# Patient Record
Sex: Male | Born: 1980 | Race: White | Hispanic: No | Marital: Married | Smoking: Former smoker
Health system: Southern US, Community
[De-identification: ages and names within clinical notes are randomized; demographics above are authoritative.]

## PROBLEM LIST (undated history)

## (undated) DIAGNOSIS — G510 Bell's palsy: Secondary | ICD-10-CM

## (undated) DIAGNOSIS — K469 Unspecified abdominal hernia without obstruction or gangrene: Secondary | ICD-10-CM

## (undated) DIAGNOSIS — K4090 Unilateral inguinal hernia, without obstruction or gangrene, not specified as recurrent: Secondary | ICD-10-CM

## (undated) DIAGNOSIS — J302 Other seasonal allergic rhinitis: Secondary | ICD-10-CM

## (undated) HISTORY — DX: Bell's palsy: G51.0

## (undated) HISTORY — PX: WISDOM TOOTH EXTRACTION: SHX21

## (undated) HISTORY — PX: TONSILLECTOMY: SUR1361

## (undated) HISTORY — PX: HERNIA REPAIR: SHX51

---

## 2009-12-12 ENCOUNTER — Emergency Department (HOSPITAL_COMMUNITY): Admission: EM | Admit: 2009-12-12 | Discharge: 2009-12-12 | Payer: Self-pay | Admitting: Emergency Medicine

## 2010-10-01 LAB — CBC
MCHC: 35.1 g/dL (ref 30.0–36.0)
MCV: 95.1 fL (ref 78.0–100.0)
Platelets: 222 10*3/uL (ref 150–400)
RDW: 13 % (ref 11.5–15.5)

## 2010-10-01 LAB — COMPREHENSIVE METABOLIC PANEL
BUN: 16 mg/dL (ref 6–23)
CO2: 27 mEq/L (ref 19–32)
Calcium: 9.8 mg/dL (ref 8.4–10.5)
Creatinine, Ser: 1.16 mg/dL (ref 0.4–1.5)
GFR calc Af Amer: >60 mL/min (ref >60)
Glucose, Bld: 117 mg/dL — ABNORMAL HIGH (ref 70–99)
Potassium: 3.7 mEq/L (ref 3.5–5.1)
Sodium: 140 mEq/L (ref 135–145)
Total Protein: 7.2 g/dL (ref 6.0–8.3)

## 2010-10-01 LAB — DIFFERENTIAL
Basophils Relative: 0 % (ref 0–1)
Eosinophils Relative: 1 % (ref 0–5)
Lymphocytes Relative: 26 % (ref 12–46)
Monocytes Relative: 8 % (ref 3–12)

## 2010-10-01 LAB — URINALYSIS, ROUTINE W REFLEX MICROSCOPIC
Glucose, UA: NEGATIVE mg/dL
Hgb urine dipstick: NEGATIVE
Protein, ur: NEGATIVE mg/dL
Specific Gravity, Urine: 1.026 (ref 1.005–1.030)
pH: 7 (ref 5.0–8.0)

## 2010-10-01 LAB — POCT CARDIAC MARKERS
CKMB, poc: <1 ng/mL — ABNORMAL LOW (ref 1.0–8.0)
Troponin i, poc: <0.05 ng/mL (ref 0.00–0.09)

## 2010-10-01 LAB — URINE MICROSCOPIC-ADD ON

## 2010-10-01 LAB — LIPASE, BLOOD: Lipase: 38 U/L (ref 11–59)

## 2010-10-01 LAB — ETHANOL: Alcohol, Ethyl (B): <5 mg/dL (ref 0–10)

## 2016-09-02 ENCOUNTER — Encounter (HOSPITAL_COMMUNITY): Payer: Self-pay | Admitting: Nurse Practitioner

## 2016-09-02 ENCOUNTER — Inpatient Hospital Stay (HOSPITAL_COMMUNITY)
Admission: EM | Admit: 2016-09-02 | Discharge: 2016-09-05 | DRG: 201 | Disposition: A | Discharge status: Home / Self Care | Payer: Managed Care, Other (non HMO) | Attending: Pulmonary Disease | Admitting: Pulmonary Disease

## 2016-09-02 ENCOUNTER — Inpatient Hospital Stay (HOSPITAL_COMMUNITY): Payer: Managed Care, Other (non HMO)

## 2016-09-02 ENCOUNTER — Emergency Department (HOSPITAL_COMMUNITY): Payer: Managed Care, Other (non HMO)

## 2016-09-02 DIAGNOSIS — Z825 Family history of asthma and other chronic lower respiratory diseases: Secondary | ICD-10-CM

## 2016-09-02 DIAGNOSIS — E669 Obesity, unspecified: Secondary | ICD-10-CM | POA: Diagnosis present

## 2016-09-02 DIAGNOSIS — J439 Emphysema, unspecified: Secondary | ICD-10-CM | POA: Diagnosis present

## 2016-09-02 DIAGNOSIS — J9311 Primary spontaneous pneumothorax: Secondary | ICD-10-CM | POA: Diagnosis present

## 2016-09-02 DIAGNOSIS — Z6834 Body mass index (BMI) 34.0-34.9, adult: Secondary | ICD-10-CM

## 2016-09-02 DIAGNOSIS — Z9889 Other specified postprocedural states: Secondary | ICD-10-CM | POA: Diagnosis not present

## 2016-09-02 DIAGNOSIS — Z87891 Personal history of nicotine dependence: Secondary | ICD-10-CM | POA: Diagnosis not present

## 2016-09-02 DIAGNOSIS — K469 Unspecified abdominal hernia without obstruction or gangrene: Secondary | ICD-10-CM | POA: Diagnosis present

## 2016-09-02 DIAGNOSIS — Z880 Allergy status to penicillin: Secondary | ICD-10-CM | POA: Diagnosis not present

## 2016-09-02 DIAGNOSIS — J939 Pneumothorax, unspecified: Secondary | ICD-10-CM

## 2016-09-02 DIAGNOSIS — J9383 Other pneumothorax: Secondary | ICD-10-CM | POA: Diagnosis present

## 2016-09-02 HISTORY — DX: Unspecified abdominal hernia without obstruction or gangrene: K46.9

## 2016-09-02 HISTORY — DX: Other seasonal allergic rhinitis: J30.2

## 2016-09-02 HISTORY — DX: Unilateral inguinal hernia, without obstruction or gangrene, not specified as recurrent: K40.90

## 2016-09-02 LAB — I-STAT CHEM 8, ED
BUN: 22 mg/dL — AB (ref 6–20)
CALCIUM ION: 1.13 mmol/L — AB (ref 1.15–1.40)
Chloride: 103 mmol/L (ref 101–111)
Creatinine, Ser: 1.2 mg/dL (ref 0.61–1.24)
Glucose, Bld: 119 mg/dL — ABNORMAL HIGH (ref 65–99)
HEMATOCRIT: 50 % (ref 39.0–52.0)
HEMOGLOBIN: 17 g/dL (ref 13.0–17.0)
Potassium: 3.6 mmol/L (ref 3.5–5.1)
SODIUM: 142 mmol/L (ref 135–145)
TCO2: 26 mmol/L (ref 0–100)

## 2016-09-02 LAB — CBC
HCT: 48.8 % (ref 39.0–52.0)
HEMOGLOBIN: 17.2 g/dL — AB (ref 13.0–17.0)
MCH: 32.6 pg (ref 26.0–34.0)
MCHC: 35.2 g/dL (ref 30.0–36.0)
MCV: 92.6 fL (ref 78.0–100.0)
PLATELETS: 263 10*3/uL (ref 150–400)
RBC: 5.27 MIL/uL (ref 4.22–5.81)
RDW: 12.6 % (ref 11.5–15.5)
WBC: 11.8 10*3/uL — ABNORMAL HIGH (ref 4.0–10.5)

## 2016-09-02 MED ORDER — SODIUM CHLORIDE 0.9% FLUSH
3.0000 mL | Freq: Two times a day (BID) | INTRAVENOUS | Status: DC
  Administered 2016-09-04: 3 mL via INTRAVENOUS

## 2016-09-02 MED ORDER — ACETAMINOPHEN 650 MG RE SUPP: 650.0000 mg | Freq: Four times a day (QID) | RECTAL | Status: DC | PRN

## 2016-09-02 MED ORDER — ACETAMINOPHEN 325 MG PO TABS: 650.0000 mg | ORAL_TABLET | Freq: Four times a day (QID) | ORAL | Status: DC | PRN

## 2016-09-02 MED ORDER — KETAMINE HCL-SODIUM CHLORIDE 100-0.9 MG/10ML-% IV SOSY
0.3000 mg/kg | PREFILLED_SYRINGE | Freq: Once | INTRAVENOUS | Status: AC
  Administered 2016-09-02: 33 mg via INTRAVENOUS
  Filled 2016-09-02: qty 10

## 2016-09-02 MED ORDER — KETOROLAC TROMETHAMINE 15 MG/ML IJ SOLN
15.0000 mg | Freq: Four times a day (QID) | INTRAMUSCULAR | Status: AC
  Administered 2016-09-02 – 2016-09-03 (×4): 15 mg via INTRAVENOUS
  Filled 2016-09-02 (×4): qty 1

## 2016-09-02 MED ORDER — HYDROCODONE-ACETAMINOPHEN 5-325 MG PO TABS
1.0000 | ORAL_TABLET | ORAL | Status: DC | PRN
  Administered 2016-09-02: 2 via ORAL
  Filled 2016-09-02: qty 2

## 2016-09-02 MED ORDER — MORPHINE SULFATE (PF) 2 MG/ML IV SOLN: 2.0000 mg | INTRAVENOUS | Status: DC | PRN

## 2016-09-02 MED ORDER — SODIUM CHLORIDE 0.9 % IV SOLN
INTRAVENOUS | Status: DC
  Administered 2016-09-02: 1000 mL via INTRAVENOUS
  Administered 2016-09-03: 16:00:00 via INTRAVENOUS

## 2016-09-02 MED ORDER — LIDOCAINE-EPINEPHRINE (PF) 2 %-1:200000 IJ SOLN
20.0000 mL | Freq: Once | INTRAMUSCULAR | Status: AC
  Administered 2016-09-02: 20 mL
  Filled 2016-09-02: qty 20

## 2016-09-02 NOTE — Sedation Documentation (Signed)
Vital signs stable. 

## 2016-09-02 NOTE — ED Notes (Signed)
Report attempted, RN to call back. 

## 2016-09-02 NOTE — H&P (Signed)
History and Physical    Richard Orozco ZOX:[id number] DOB: 20-Feb-1981 DOA: 09/02/2016  PCP: Noni SaupeEDDING II,JOHN F., MD   Patient coming from: Home  Chief Complaint: Advised to report to the ED after abnormal outpatient chest xray  HPI: Richard Orozco is a 36 y.o. gentleman with a history of inguinal hernia and recent bronchitis and sinusitis treated as an outpatient with antibiotics and steroid dose pack approximately two weeks ago.  In the past 7-9 days, he has had recurring discomfort in his upper back on the right.  He does not really call it pain, but he describes it as a "popping sensation" or feeling like he is being punched in his back.  He has had pleuritic chest pain as well.  No significant shortness of breath.  He has occasional palpitation that he relates to anxiety, but this has not been different in the past 1-2 weeks.  No substernal chest pain, pressure, or tightness.  No syncope or LOC.  No fever.  No nausea, vomiting, or diarrhea.  The patient presented to urgent care yesterday for evaluation.  Outpatient chest xray was obtained and revealed a spontaneous pneumothorax on the right.  The patient was contacted at home and advised to present to the ED for management.    ED Course: Right-sided chest tube placed in the ED by the ED attending.  Hospitalist asked to admit.  Pulmonary consult requested for assistance with management.  The patient was seen and evaluated on the floor.  Currently complaining of chest wall pain after placement of chest tube.  Wife at bedside.  Review of Systems: As per HPI otherwise 10 point review of systems negative.    Past Medical History:  Diagnosis Date  . Hernia of abdominal cavity   . Inguinal hernia     Past Surgical History:  Procedure Laterality Date  . TONSILLECTOMY    . WISDOM TOOTH EXTRACTION       reports that he has quit smoking. He has never used smokeless tobacco. He reports that he drinks alcohol. He reports that he does not use  drugs.  He is married.  He has two children.  He works for [employer].  Allergies  Allergen Reactions  . Penicillins Other (See Comments)    Childhood allergy   . Shellfish Allergy Other (See Comments)    Family allergy (severe reaction)    Family History  Problem Relation Age of Onset  . Hemachromatosis Mother   . COPD Maternal Grandmother      Prior to Admission medications   Not on File    Physical Exam: Vitals:   09/02/16 1858 09/02/16 1907 09/02/16 1913 09/02/16 1927  BP: 154/97 (!) 206/101 170/94 148/98  Pulse: 69 94 77 60  Resp: 21 (!) 27 25 21   Temp:      TempSrc:      SpO2: 100% 100% 98% 99%  Weight:          Constitutional: NAD, calm, in pain after chest tube placed in the ED Vitals:   09/02/16 1858 09/02/16 1907 09/02/16 1913 09/02/16 1927  BP: 154/97 (!) 206/101 170/94 148/98  Pulse: 69 94 77 60  Resp: 21 (!) 27 25 21   Temp:      TempSrc:      SpO2: 100% 100% 98% 99%  Weight:       Eyes: PERRL, lids and conjunctivae normal ENMT: Mucous membranes are slightly dry.  Posterior pharynx NOT visualized.  Normal dentition.  Neck: normal appearance, thick but supple, no  masses Respiratory: clear to auscultation listening anteriorly.  No wheezing, no crackles. Normal respiratory effort. No accessory muscle use.  Cardiovascular: Normal rate, regular rhythm, no murmurs / rubs / gallops. No extremity edema. 2+ pedal pulses. GI: abdomen is soft and compressible.  No distention.  No tenderness.  No masses palpated.  Bowel sounds are present. Musculoskeletal:  No joint deformity in upper and lower extremities. Good ROM, no contractures. Normal muscle tone.  Skin: no rashes, warm and dry Neurologic: CN 2-12 grossly intact. Sensation intact, Strength symmetric bilaterally, 5/5.  Psychiatric: Normal judgment and insight. Alert and oriented x 3. Normal mood.     Labs on Admission: I have personally reviewed following labs and imaging studies  CBC:  Recent  Labs Lab 09/02/16 1704 09/02/16 1717  WBC 11.8*  --   HGB 17.2* 17.0  HCT 48.8 50.0  MCV 92.6  --   PLT 263  --    Basic Metabolic Panel:  Recent Labs Lab 09/02/16 1717  NA 142  K 3.6  CL 103  GLUCOSE 119*  BUN 22*  CREATININE 1.20   GFR: CrCl cannot be calculated (Unknown ideal weight.).   Radiological Exams on Admission: Dg Chest 2 View  Result Date: 09/02/2016 CLINICAL DATA:  Lower right-sided and back pain for the past week. The patient was found to have a pneumothorax on x-ray an urgent care yesterday. EXAM: CHEST  2 VIEW COMPARISON:  None in PACs FINDINGS: There is an approximately 30% right-sided pneumothorax. There is no mediastinal shift. The left lung is clear. The heart and pulmonary vascularity are normal. There is pleural thickening along the lateral thoracic walls bilaterally. The bony thorax exhibits no acute abnormality. IMPRESSION: There is an approximately 30% right-sided pneumothorax without mediastinal shift. Electronically Signed   By: David  [place] M.D.   On: 09/02/2016 14:28   Dg Chest Portable 1 View  Result Date: 09/02/2016 CLINICAL DATA:  Status post chest tube placement for pneumothorax today. EXAM: PORTABLE CHEST 1 VIEW COMPARISON:  PA and lateral chest earlier today. FINDINGS: New pigtail catheter is in place in the right chest. Right pneumothorax seen on the prior examination is much smaller. Bullous lesion in the right mid lung is again seen. The examination is otherwise unchanged. IMPRESSION: Near complete resolution of right pneumothorax after chest tube placement. Bullous lesion in the right chest again seen. Electronically Signed   By: Drusilla Kanner M.D.   On: 09/02/2016 19:48    Assessment/Plan Principal Problem:   Pneumothorax Active Problems:   Hernia of abdominal cavity      Spontaneous pneumothorax, cause unclear --Leave chest tube to continuous suction for now --Serial chest xrays --Pulmonary to see in the AM --Analgesics as  needed   DVT prophylaxis: SCDs Code Status: FULL Family Communication: Patient's wife at bedside at time of admission. Disposition Plan: Expect he will go home when ready for discharge. Consults called: Pulmonary Admission status: Inpatient, stepdown unit.  I expect this patient will need inpatient services for greater than two midnights.   TIME SPENT: 60 minutes   Jerene Bears MD [facility] Pager [phone]  If 7PM-7AM, please contact night-coverage [email or web address] Password [email or web address]  09/02/2016, 9:18 PM

## 2016-09-02 NOTE — Sedation Documentation (Signed)
Pt fully awake and alert in no distress ER Resident and ER MD at bedside procedure complete Chest tube drainage compartment placed on continuous low suction pt tolerated well

## 2016-09-02 NOTE — Sedation Documentation (Signed)
Patient denies pain and is resting comfortably.  

## 2016-09-02 NOTE — Sedation Documentation (Signed)
Pt remains on monitor in no distress [facility] chest tube insertion procedure pt remains on monitor in no distress 100% NRB remains on pt.

## 2016-09-02 NOTE — ED Triage Notes (Signed)
Pt presents with c/o pneumothorax. He had an xray in PCP office yesterday for a popping sensation in chest with inspiration for past 1 week. The popping started after he had gotten over cough-cold symptoms. PCP called him today and told him to go to ER for pneumothorax found on xray.

## 2016-09-02 NOTE — ED Provider Notes (Addendum)
[facility]-EMERGENCY DEPT Provider Note   CSN: [id number] Arrival date & time: 09/02/16  1331     History   Chief Complaint Chief Complaint  Patient presents with  . Follow-up    HPI [name] is a 36 y.o. male.  HPI Pt comes in with cc of pneumothorax. PT has no medical hx, he is not a smoker, has no congenital medical problems, no lung disease. He reports that 2 weeks ago he had URI and started having severe cough. 1 weeks ago pt started having RIGHT sided chest discomfort, worse with deep inspiration. Pt saw PCP yday, was seen to have a pneumothorax, and sent to the ER. Pt denies any trauma. No DIB. Pt is not on blood thinners.  Past Medical History:  Diagnosis Date  . Hernia of abdominal cavity     Patient Active Problem List   Diagnosis Date Noted  . Pneumothorax 09/02/2016    History reviewed. No pertinent surgical history.     Home Medications    Prior to Admission medications   Not on File    Family History History reviewed. No pertinent family history.  Social History Social History  Substance Use Topics  . Smoking status: Never Smoker  . Smokeless tobacco: Never Used  . Alcohol use Yes     Allergies   Penicillins and Shellfish allergy   Review of Systems Review of Systems  Constitutional: Negative for activity change.  Respiratory: Negative for shortness of breath.   Cardiovascular: Positive for chest pain.    ROS 10 Systems reviewed and are negative for acute change except as noted in the HPI.     Physical Exam Updated Vital Signs BP 148/98 (BP Location: Left Arm)   Pulse 60   Temp 98.7 F (37.1 C) (Oral)   Resp 21   Wt 242 lb (109.8 kg)   SpO2 99%   Physical Exam  Constitutional: He is oriented to person, place, and time. He appears well-developed.  HENT:  Head: Normocephalic and atraumatic.  Eyes: Conjunctivae and EOM are normal. Pupils are equal, round, and reactive to light.  Neck: Normal range of motion. Neck  supple.  Cardiovascular: Normal rate and regular rhythm.   Pulmonary/Chest: Effort normal and breath sounds normal.  Diminished air movement on the R side  Abdominal: Soft. Bowel sounds are normal. He exhibits no distension and no mass. There is no tenderness. There is no rebound and no guarding.  Musculoskeletal: He exhibits no deformity.  Neurological: He is alert and oriented to person, place, and time.  Skin: Skin is warm.  Nursing note and vitals reviewed.    ED Treatments / Results  Labs (all labs ordered are listed, but only abnormal results are displayed) Labs Reviewed  CBC - Abnormal; Notable for the following:       Result Value   WBC 11.8 (*)    Hemoglobin 17.2 (*)    All other components within normal limits  I-STAT CHEM 8, ED - Abnormal; Notable for the following:    BUN 22 (*)    Glucose, Bld 119 (*)    Calcium, Ion 1.13 (*)    All other components within normal limits    EKG  EKG Interpretation None       Radiology Dg Chest 2 View  Result Date: 09/02/2016 CLINICAL DATA:  Lower right-sided and back pain for the past week. The patient was found to have a pneumothorax on x-ray an urgent care yesterday. EXAM: CHEST  2 VIEW COMPARISON:  None in PACs FINDINGS: There is an approximately 30% right-sided pneumothorax. There is no mediastinal shift. The left lung is clear. The heart and pulmonary vascularity are normal. There is pleural thickening along the lateral thoracic walls bilaterally. The bony thorax exhibits no acute abnormality. IMPRESSION: There is an approximately 30% right-sided pneumothorax without mediastinal shift. Electronically Signed   By: David  [place] M.D.   On: 09/02/2016 14:28   Dg Chest Portable 1 View  Result Date: 09/02/2016 CLINICAL DATA:  Status post chest tube placement for pneumothorax today. EXAM: PORTABLE CHEST 1 VIEW COMPARISON:  PA and lateral chest earlier today. FINDINGS: New pigtail catheter is in place in the right chest. Right  pneumothorax seen on the prior examination is much smaller. Bullous lesion in the right mid lung is again seen. The examination is otherwise unchanged. IMPRESSION: Near complete resolution of right pneumothorax after chest tube placement. Bullous lesion in the right chest again seen. Electronically Signed   By: Drusilla Kanner M.D.   On: 09/02/2016 19:48    Procedures CHEST TUBE INSERTION Date/Time: 09/02/2016 8:11 PM Performed by: Derwood Kaplan Authorized by: Derwood Kaplan   Consent:    Consent obtained:  Written   Consent given by:  Patient   Risks discussed:  Bleeding, incomplete drainage, nerve damage, damage to surrounding structures, infection and pain   Alternatives discussed:  Alternative treatment Pre-procedure details:    Skin preparation:  ChloraPrep Anesthesia (see MAR for exact dosages):    Anesthesia method:  Local infiltration   Local anesthetic:  Lidocaine 1% WITH epi Procedure details:    Placement location:  R anterior   Scalpel size:  10   Tube size (Fr):  8   Ultrasound guidance: no     Tension pneumothorax: no     Tube connected to:  Suction   Drainage characteristics:  Air only   Suture material:  2-0 silk   Dressing:  Xeroform gauze and 4x4 sterile gauze Post-procedure details:    Post-insertion x-ray findings: tube in good position     Patient tolerance of procedure:  Tolerated well, no immediate complications   (including critical care time) CRITICAL CARE Performed by: Derwood Kaplan   Total critical care time: 42  minutes  Critical care time was exclusive of separately billable procedures and treating other patients.  Critical care was necessary to treat or prevent imminent or life-threatening deterioration.  Critical care was time spent personally by me on the following activities: development of treatment plan with patient and/or surrogate as well as nursing, discussions with consultants, evaluation of patient's response to treatment,  examination of patient, obtaining history from patient or surrogate, ordering and performing treatments and interventions, ordering and review of laboratory studies, ordering and review of radiographic studies, pulse oximetry and re-evaluation of patient's condition.   Medications Ordered in ED Medications  lidocaine-EPINEPHrine (XYLOCAINE W/EPI) 2 %-1:200000 (PF) injection 20 mL (20 mLs Infiltration Given 09/02/16 1852)  ketamine 100 mg in normal saline 10 mL (10mg /mL) syringe (33 mg Intravenous Given 09/02/16 1853)     Initial Impression / Assessment and Plan / ED Course  I have reviewed the triage vital signs and the nursing notes.  Pertinent labs & imaging results that were available during my care of the patient were reviewed by me and considered in my medical decision making (see chart for details).     Pt has a pneumothorax. 30% occlusion. Spoke with Dr. Levada Schilling, Pulm critical care. He agrees that pt should get pig tail catheter in  this case and recommends inpatient admission. Medicine consulted. Will ask pulm to put their recs tomorrow to help discontinue the chest tube.  Procedure completed w/o any complications.  The tube is to suction right now. Pt should get another xrays in 4 hours - and once the PTX resolves, pt should be placed to heimlich valve only or water seal. Pulmonary to probably give the best recs at discontinuing the catheter.   Final Clinical Impressions(s) / ED Diagnoses   Final diagnoses:  Primary spontaneous pneumothorax    New Prescriptions New Prescriptions   No medications on file     Derwood Kaplan, MD 09/02/16 2013    Derwood Kaplan, MD 09/02/16 2013

## 2016-09-02 NOTE — Sedation Documentation (Signed)
ER Resident and ER MD at bedside pt remains awake and alert Resident injecting lidocaine into R chest wall

## 2016-09-02 NOTE — Sedation Documentation (Signed)
ED Provider at bedside procedure complete Port CXR at  Bedside

## 2016-09-02 NOTE — ED Notes (Signed)
Pt placed on NRB due to collapsed lung pt does not appear SOB SPO2 at 95% RA

## 2016-09-03 ENCOUNTER — Encounter (HOSPITAL_COMMUNITY): Payer: Self-pay | Admitting: Acute Care

## 2016-09-03 ENCOUNTER — Inpatient Hospital Stay (HOSPITAL_COMMUNITY): Payer: Managed Care, Other (non HMO)

## 2016-09-03 DIAGNOSIS — J9311 Primary spontaneous pneumothorax: Principal | ICD-10-CM

## 2016-09-03 LAB — CBC
HEMATOCRIT: 45.9 % (ref 39.0–52.0)
Hemoglobin: 16 g/dL (ref 13.0–17.0)
MCH: 32.5 pg (ref 26.0–34.0)
MCHC: 34.9 g/dL (ref 30.0–36.0)
MCV: 93.3 fL (ref 78.0–100.0)
Platelets: 271 10*3/uL (ref 150–400)
RBC: 4.92 MIL/uL (ref 4.22–5.81)
RDW: 12.7 % (ref 11.5–15.5)
WBC: 13.2 10*3/uL — AB (ref 4.0–10.5)

## 2016-09-03 LAB — BASIC METABOLIC PANEL WITH GFR
Anion gap: 8 (ref 5–15)
BUN: 21 mg/dL — ABNORMAL HIGH (ref 6–20)
CO2: 24 mmol/L (ref 22–32)
Calcium: 9 mg/dL (ref 8.9–10.3)
Chloride: 107 mmol/L (ref 101–111)
Creatinine, Ser: 1.15 mg/dL (ref 0.61–1.24)
GFR calc Af Amer: >60 mL/min
GFR calc non Af Amer: >60 mL/min
Glucose, Bld: 124 mg/dL — ABNORMAL HIGH (ref 65–99)
Potassium: 3.5 mmol/L (ref 3.5–5.1)
Sodium: 139 mmol/L (ref 135–145)

## 2016-09-03 LAB — MRSA PCR SCREENING: MRSA by PCR: NEGATIVE

## 2016-09-03 MED ORDER — HEPARIN SODIUM (PORCINE) 5000 UNIT/ML IJ SOLN
5000.0000 [IU] | Freq: Three times a day (TID) | INTRAMUSCULAR | Status: DC
  Administered 2016-09-03 – 2016-09-05 (×6): 5000 [IU] via SUBCUTANEOUS
  Filled 2016-09-03 (×6): qty 1

## 2016-09-03 NOTE — Progress Notes (Signed)
PROGRESS NOTE                                                                                                                                                                                                             Patient Demographics:    Richard Orozco, is a 36 y.o. male, DOB - 01-25-1981, DGU:[id number]  Admit date - 09/02/2016   Admitting Physician Kentravious Litter, MD  Outpatient Primary MD for the patient is [facility] Valrie Hart., MD  LOS - 1  Chief Complaint  Patient presents with  . Follow-up       Brief Narrative    36 y.o. male with a history of inguinal hernia and recent bronchitis and sinusitis treated as an outpatient with antibiotics and steroid dose pack approximately two weeks ago, And by his PCP for pleuritic chest pain, and chest discomfort, found to have pneumothorax by PCP so he was sent to ED for evaluation, I did chest tube inserted by ED physician, pulmonary consulted for assistance with management.   Subjective:    Richard Orozco today has, No headache, Bore chest pain is better controlled currently No abdominal pain - No Nausea,  No Cough - SOB.    Assessment  & Plan :    Principal Problem:   Pneumothorax Active Problems:   Hernia of abdominal cavity   Spontaneous pneumothorax  - This is most likely related to significant coughing from recent viral illness, chest tube/Pig tail  inserted by ED physician, currently on continuous suction, pulmonary service consulted, to see today, continue with when necessary pain medications, will check chest x-ray today.  Code Status : Full  Family Communication  : None at bedside  Disposition Plan  : home when stable  Consults  :  PCCM  Procedures  : Chest tube/pigtail catheter  insertion by ED  DVT Prophylaxis  :   Heparin - SCDs   Lab Results  Component Value Date   PLT 271 09/03/2016    Antibiotics  :    Anti-infectives    None        Objective:   Vitals:   09/02/16 2035 09/02/16 2345 09/03/16 0408 09/03/16 0736  BP: 138/87 125/74 108/73 124/79  Pulse: 66 76 62 60  Resp: (!) 21 17 19  (!) 25  Temp: 98.6 F (37 C) 98.3 F (36.8 C) 98.1  F (36.7 C) 98 F (36.7 C)  TempSrc: Oral Oral Oral Oral  SpO2: 100% 93% 92% 95%  Weight:        Wt Readings from Last 3 Encounters:  09/02/16 109.8 kg (242 lb)     Intake/Output Summary (Last 24 hours) at 09/03/16 1045 Last data filed at 09/03/16 0600  Gross per 24 hour  Intake           641.67 ml  Output                0 ml  Net           641.67 ml     Physical Exam  Awake Alert, Oriented X 3, Supple Neck,No JVD, .  Symmetrical Chest wall movement, diminished air movement in the right lung, no wheezing, has right chest pigtail  RRR,No Gallops,Rubs or new Murmurs, No Parasternal Heave +ve B.Sounds, Abd Soft, No tenderness, No rebound - guarding or rigidity. No Cyanosis, Clubbing or edema, No new Rash or bruise     Data Review:    CBC  Recent Labs Lab 09/02/16 1704 09/02/16 1717 09/03/16 0212  WBC 11.8*  --  13.2*  HGB 17.2* 17.0 16.0  HCT 48.8 50.0 45.9  PLT 263  --  271  MCV 92.6  --  93.3  MCH 32.6  --  32.5  MCHC 35.2  --  34.9  RDW 12.6  --  12.7    Chemistries   Recent Labs Lab 09/02/16 1717 09/03/16 0212  NA 142 139  K 3.6 3.5  CL 103 107  CO2  --  24  GLUCOSE 119* 124*  BUN 22* 21*  CREATININE 1.20 1.15  CALCIUM  --  9.0   ------------------------------------------------------------------------------------------------------------------ No results for input(s): CHOL, HDL, LDLCALC, TRIG, CHOLHDL, LDLDIRECT in the last 72 hours.  No results found for: HGBA1C ------------------------------------------------------------------------------------------------------------------ No results for input(s): TSH, T4TOTAL, T3FREE, THYROIDAB in the last 72 hours.  Invalid input(s):  FREET3 ------------------------------------------------------------------------------------------------------------------ No results for input(s): VITAMINB12, FOLATE, FERRITIN, TIBC, IRON, RETICCTPCT in the last 72 hours.  Coagulation profile No results for input(s): INR, PROTIME in the last 168 hours.  No results for input(s): DDIMER in the last 72 hours.  Cardiac Enzymes No results for input(s): CKMB, TROPONINI, MYOGLOBIN in the last 168 hours.  Invalid input(s): CK ------------------------------------------------------------------------------------------------------------------ No results found for: BNP  Inpatient Medications  Scheduled Meds: . ketorolac  15 mg Intravenous Q6H  . sodium chloride flush  3 mL Intravenous Q12H   Continuous Infusions: . sodium chloride 1,000 mL (09/02/16 2335)   PRN Meds:.acetaminophen **OR** acetaminophen, HYDROcodone-acetaminophen, morphine injection  Micro Results Recent Results (from the past 240 hour(s))  MRSA PCR Screening     Status: None   Collection Time: 09/02/16  9:52 PM  Result Value Ref Range Status   MRSA by PCR NEGATIVE NEGATIVE Final    Comment:        The GeneXpert MRSA Assay (FDA approved for NASAL specimens only), is one component of a comprehensive MRSA colonization surveillance program. It is not intended to diagnose MRSA infection nor to guide or monitor treatment for MRSA infections.     Radiology Reports Dg Chest 2 View  Result Date: 09/02/2016 CLINICAL DATA:  Lower right-sided and back pain for the past week. The patient was found to have a pneumothorax on x-ray an urgent care yesterday. EXAM: CHEST  2 VIEW COMPARISON:  None in PACs FINDINGS: There is an approximately 30% right-sided pneumothorax. There is no mediastinal  shift. The left lung is clear. The heart and pulmonary vascularity are normal. There is pleural thickening along the lateral thoracic walls bilaterally. The bony thorax exhibits no acute  abnormality. IMPRESSION: There is an approximately 30% right-sided pneumothorax without mediastinal shift. Electronically Signed   By: David  [place] M.D.   On: 09/02/2016 14:28   Dg Chest Port 1 View  Result Date: 09/02/2016 CLINICAL DATA:  Follow-up pneumothorax EXAM: PORTABLE CHEST 1 VIEW COMPARISON:  09/02/2016 FINDINGS: Right lower chest tube remains in place. Left lung is clear. Persistent small to moderate right pneumothorax, this appears slightly increased in size, and demonstrate 7 mm pleural-parenchymal separation laterally at the apex and 3.2 cm separation at the right mid chest laterally. No midline shift. Stable cardiomegaly. IMPRESSION: Right-sided chest tube remains in place. There is slight interval increase in size of the right-sided pneumothorax. Electronically Signed   By: Jasmine Pang M.D.   On: 09/02/2016 23:24   Dg Chest Portable 1 View  Result Date: 09/02/2016 CLINICAL DATA:  Status post chest tube placement for pneumothorax today. EXAM: PORTABLE CHEST 1 VIEW COMPARISON:  PA and lateral chest earlier today. FINDINGS: New pigtail catheter is in place in the right chest. Right pneumothorax seen on the prior examination is much smaller. Bullous lesion in the right mid lung is again seen. The examination is otherwise unchanged. IMPRESSION: Near complete resolution of right pneumothorax after chest tube placement. Bullous lesion in the right chest again seen. Electronically Signed   By: Drusilla Kanner M.D.   On: 09/02/2016 19:48     Sharna Gabrys M.D on 09/03/2016 at 10:45 AM  Between 7am to 7pm - Pager - [phone]  After 7pm go to [email or web address] - password [facility]  [facility] -  Office  [phone]

## 2016-09-03 NOTE — Care Management Note (Signed)
Case Management Note  Patient Details  Name: Richard Orozco MRN: [id number] Date of Birth: 05/14/1981  Subjective/Objective:   Presents with ptx, had chest tube inserted, he lives with spouse, pta indep, he has PCP Dr. Gwendlyn DeutscherJohn Redding at [facility] [place], he has medication coverage thru [employer], he has transport at dc.  NCM will cont to follow for dc needs.                 Action/Plan:   Expected Discharge Date:  09/05/16               Expected Discharge Plan:  Home/Self Care  In-House Referral:     Discharge planning Services  CM Consult  Post Acute Care Choice:    Choice offered to:     DME Arranged:    DME Agency:     HH Arranged:    HH Agency:     Status of Service:  In process, will continue to follow  If discussed at Long Length of Stay Meetings, dates discussed:    Additional Comments:  Leone Havenaylor, Dsean Vantol Clinton, RN 09/03/2016, 2:31 PM

## 2016-09-03 NOTE — Progress Notes (Signed)
NP who was by to see patient recommended that patient get up to the chair today. Patient with one assist was able to get to the chair, sit for almost an hour, and one assist back to the bed. Patient has been using the incentive spirometer and getting up to 1500.

## 2016-09-03 NOTE — Progress Notes (Signed)
This note also relates to the following rows which could not be included: Pulse Rate - Cannot attach notes to unvalidated device data Resp - Cannot attach notes to unvalidated device data  Pt placed on nasal cannula 2 Lpm per NP order.  Pt tolerating well at this time. RN notified.

## 2016-09-03 NOTE — Consult Note (Signed)
Name: Richard Orozco MRN: [id number] DOB: 07-23-1980    ADMISSION DATE:  09/02/2016 CONSULTATION DATE: 09/03/2016  REFERRING MD : [facility]  CHIEF COMPLAINT:  Chest tube management for right anterior 30%  pneumothorax without mediastinal shift  BRIEF PATIENT DESCRIPTION:  Alert oriented  male supine in bed, with Right anterior chest tube to suction. Talkative and appropriate.   SIGNIFICANT EVENTS  09/02/2016>> Right Anterior 8 Fr Chest tube placement   STUDIES: CXR 2/19 >> 1425 IMPRESSION: There is an approximately 30% right-sided pneumothorax without mediastinal shift.  CXR 2/19>> 1938 IMPRESSION: Near complete resolution of right pneumothorax after chest tube Placement.Notation of bullous lesion right mid lung  CXR 2/19>> 2133R  CT in place, interval increase in size of  Right sided pneumothorax. 7 mm pleural-parenchymal separation laterally at the apex and 3.2 cm separation at the right mid chest laterally. No midline shift. Stable cardiomegaly.   HISTORY OF PRESENT ILLNESS:   36 y.o.male former smoker ( 2 packs per week x 9 years, quit 13 years ago) with a history of inguinal hernia and recent bronchitis and sinusitis. He was  treated as an outpatient with antibiotics and steroid dose pack approximately two weeks ago by his PCP.  In the past 7-9 days, he has had recurring discomfort in his upper back on the right. Worse on inspiration. He does not really call it pain, but he describes it as a "popping sensation" or feeling like he is being punched in his back.  He has had pleuritic chest pain as well.  No significant shortness of breath.  He has occasional palpitation that he relates to anxiety, but this has not been different in the past 1-2 weeks.  No substernal chest pain, pressure, or tightness.  No syncope or LOC.  No fever.  No nausea, vomiting, or diarrhea. The patient presented to urgent care 2/19  for evaluation.  Outpatient chest xray was obtained and revealed a  spontaneous pneumothorax on the right.( 30% without mediastinal shift )  The patient was contacted at home and advised to present to the ED 2/19  for management. An 8 Fr Right Anterior  sided chest tube was placed by the attending ED physician. It was placed to suction post procedure Hospitalist admitted patient, and are requesting PCCM assistance in management of the chest tube.   PAST MEDICAL HISTORY :   has a past medical history of Hernia of abdominal cavity and Inguinal hernia.  has a past surgical history that includes Tonsillectomy and Wisdom tooth extraction. Prior to Admission medications   Not on File   Allergies  Allergen Reactions  . Penicillins Other (See Comments)    Childhood allergy   . Shellfish Allergy Other (See Comments)    Family allergy (severe reaction)    FAMILY HISTORY:  family history includes COPD in his maternal grandmother; Hemachromatosis in his mother. SOCIAL HISTORY:  reports that he has quit smoking. He has never used smokeless tobacco. He reports that he drinks alcohol. He reports that he does not use drugs.  REVIEW OF SYSTEMS:   Constitutional: Negative for fever, chills, weight loss, malaise/fatigue and diaphoresis.  HENT: Negative for hearing loss, ear pain, nosebleeds, congestion, sore throat, neck pain, tinnitus and ear discharge.   Eyes: Negative for blurred vision, double vision, photophobia, pain, discharge and redness.  Respiratory: Positive  for recent cough, No hemoptysis, sputum production,  Improving shortness of breath, no wheezing and stridor.   Cardiovascular: Negative for chest pain, palpitations, orthopnea, claudication, leg swelling and  PND.  Gastrointestinal: Negative for heartburn, nausea, vomiting, abdominal pain, diarrhea, constipation, blood in stool and melena.  Genitourinary: Negative for dysuria, urgency, frequency, hematuria and flank pain.  Musculoskeletal: Negative for myalgias, back pain, joint pain and falls.  Skin:  Negative for itching and rash.  Neurological: Negative for dizziness, tingling, tremors, sensory change, speech change, focal weakness, seizures, loss of consciousness, weakness and headaches.  Endo/Heme/Allergies: Negative for environmental allergies and polydipsia. Does not bruise/bleed easily.  SUBJECTIVE:  Breathing better, No c/o shortness of breath. Pain score 3 at insertion site.  VITAL SIGNS: Temp:  [98 F (36.7 C)-98.7 F (37.1 C)] 98 F (36.7 C) (02/20 0736) Pulse Rate:  [60-94] 60 (02/20 0736) Resp:  [16-27] 25 (02/20 0736) BP: (108-206)/(73-104) 124/79 (02/20 0736) SpO2:  [92 %-100 %] 95 % (02/20 0736) Weight:  [242 lb (109.8 kg)] 242 lb (109.8 kg) (02/19 1820)  PHYSICAL EXAMINATION: General: Alert and oriented x3, well developed male with Right sided chest tube. Neuro:A&O x 3, MAE x 4, appropriate  HEENT:Normocephalic and atraumatic Cardiovascular: Regular rate and rhythm, no rubs murmur or gallop. Lungs:Right CT to 20 cm suction, no leak noted,respirations regular and unlabored, clear throughout. Diminished per bases due to guarding. Abdomen:Soft and flat, BS positive, non-tender to palpation. Musculoskeletal: NO deformity of abnormality noted. Skin: Tattoo noted, otherwise warm and dry.   Recent Labs Lab 09/02/16 1717 09/03/16 0212  NA 142 139  K 3.6 3.5  CL 103 107  CO2  --  24  BUN 22* 21*  CREATININE 1.20 1.15  GLUCOSE 119* 124*    Recent Labs Lab 09/02/16 1704 09/02/16 1717 09/03/16 0212  HGB 17.2* 17.0 16.0  HCT 48.8 50.0 45.9  WBC 11.8*  --  13.2*  PLT 263  --  271   Dg Chest 2 View  Result Date: 09/02/2016 CLINICAL DATA:  Lower right-sided and back pain for the past week. The patient was found to have a pneumothorax on x-ray an urgent care yesterday. EXAM: CHEST  2 VIEW COMPARISON:  None in PACs FINDINGS: There is an approximately 30% right-sided pneumothorax. There is no mediastinal shift. The left lung is clear. The heart and pulmonary  vascularity are normal. There is pleural thickening along the lateral thoracic walls bilaterally. The bony thorax exhibits no acute abnormality. IMPRESSION: There is an approximately 30% right-sided pneumothorax without mediastinal shift. Electronically Signed   By: David  [place] M.D.   On: 09/02/2016 14:28   Dg Chest Port 1 View  Result Date: 09/02/2016 CLINICAL DATA:  Follow-up pneumothorax EXAM: PORTABLE CHEST 1 VIEW COMPARISON:  09/02/2016 FINDINGS: Right lower chest tube remains in place. Left lung is clear. Persistent small to moderate right pneumothorax, this appears slightly increased in size, and demonstrate 7 mm pleural-parenchymal separation laterally at the apex and 3.2 cm separation at the right mid chest laterally. No midline shift. Stable cardiomegaly. IMPRESSION: Right-sided chest tube remains in place. There is slight interval increase in size of the right-sided pneumothorax. Electronically Signed   By: Jasmine Pang M.D.   On: 09/02/2016 23:24   Dg Chest Portable 1 View  Result Date: 09/02/2016 CLINICAL DATA:  Status post chest tube placement for pneumothorax today. EXAM: PORTABLE CHEST 1 VIEW COMPARISON:  PA and lateral chest earlier today. FINDINGS: New pigtail catheter is in place in the right chest. Right pneumothorax seen on the prior examination is much smaller. Bullous lesion in the right mid lung is again seen. The examination is otherwise unchanged. IMPRESSION: Near complete resolution of  right pneumothorax after chest tube placement. Bullous lesion in the right chest again seen. Electronically Signed   By: Drusilla Kannerhomas  Dalessio M.D.   On: 09/02/2016 19:48    ASSESSMENT / PLAN:  Spontaneous Right Anterior 30% Pneumothorax without Mediastinal shift post bronchitis/ viral illness with cough. 8 Fr. Pigtail cath placed 2/19, to 20 cm suction  per ED Attending No drainage/ No leak Site intact and tender No family history of pulmonary fibrosis/sarcoid or other ILD that he is aware  of.  Plan: CXR now PA and Lat CXR in am 2/21 Continue to suction 20 cm for now. Consider water seal this evening  if CXR today shows further resolution of pneumothorax. Maintain oxygen saturations > 95% Oxygen at 2 L [place] to assist in resolution of pneumo OOB to Chair with assist IS Encourage mobilization as able Pain management per primary team. Follow up with Pulmonary as outpatient to ensure resolution and no underlying  diagnosis that could explain etiology.( HRCT)  Leukocytosis Plan: Care per primary team Trend WBC/ Fever curve CXR as above    Bevelyn NgoSarah F. Groce, AGACNP-BC [facility] Pager: [phone]  09/03/2016, 11:18 AM

## 2016-09-04 ENCOUNTER — Inpatient Hospital Stay (HOSPITAL_COMMUNITY): Payer: Managed Care, Other (non HMO)

## 2016-09-04 LAB — CBC
HEMATOCRIT: 46.7 % (ref 39.0–52.0)
HEMOGLOBIN: 16.2 g/dL (ref 13.0–17.0)
MCH: 32.4 pg (ref 26.0–34.0)
MCHC: 34.7 g/dL (ref 30.0–36.0)
MCV: 93.4 fL (ref 78.0–100.0)
Platelets: 229 10*3/uL (ref 150–400)
RBC: 5 MIL/uL (ref 4.22–5.81)
RDW: 12.8 % (ref 11.5–15.5)
WBC: 12.6 10*3/uL — ABNORMAL HIGH (ref 4.0–10.5)

## 2016-09-04 LAB — BASIC METABOLIC PANEL
Anion gap: 8 (ref 5–15)
BUN: 12 mg/dL (ref 6–20)
CHLORIDE: 105 mmol/L (ref 101–111)
CO2: 24 mmol/L (ref 22–32)
CREATININE: 0.97 mg/dL (ref 0.61–1.24)
Calcium: 8.9 mg/dL (ref 8.9–10.3)
GFR calc Af Amer: >60 mL/min (ref >60)
GFR calc non Af Amer: >60 mL/min (ref >60)
Glucose, Bld: 113 mg/dL — ABNORMAL HIGH (ref 65–99)
POTASSIUM: 3.9 mmol/L (ref 3.5–5.1)
Sodium: 137 mmol/L (ref 135–145)

## 2016-09-04 NOTE — Progress Notes (Addendum)
[facility] TEAM 1 - Stepdown/ICU TEAM  Pike Scantlebury  ZOX:[id number] DOB: [birth year] DOA: 09/02/2016 PCP: Noni Saupe., MD    Brief Narrative:  36 y.o.male with a history of inguinal hernia and bronchitis and sinusitis 2 weeks prior treated as an outpatient with antibiotics and steroid dose pack who presented to his PCP for pleuritic chest pain.  He was found to have pneumothorax and was sent to the ED for evaluation.  A chest tube was inserted by the ED physician.    Subjective: All active issues in this o/w healthy 35yo are being addressed by the Pulmonary service.  I have nothing to offer in addition, and have therefore not seen the pt today.    Spoke w/ Dr. Kristopher Oppenheim who has graciously agreed to assume attending role for this patient.  [facility] will sign off.    Assessment & Plan:  Spontaneous PTX  Obesity - Body mass index is 34.72 kg/m.  DVT prophylaxis: SQ heparin Code Status: FULL CODE Family Communication: no family present at time of exam  Disposition Plan: per PCCM   Consultants:  PCCM  Procedures: 2/19 Chest tube insertion in ED  Antimicrobials:  Anti-infectives    None     Objective: Blood pressure (!) 140/92, pulse 65, temperature 98.7 F (37.1 C), temperature source Oral, resp. rate (!) 23, height 5\' 10"  (1.778 m), weight 109.8 kg (242 lb), SpO2 94 %.  Intake/Output Summary (Last 24 hours) at 09/04/16 1329 Last data filed at 09/04/16 1202  Gross per 24 hour  Intake          1094.58 ml  Output             1600 ml  Net          -505.42 ml   Filed Weights   09/02/16 1800 09/02/16 1820 09/04/16 0327  Weight: 109.8 kg (242 lb) 109.8 kg (242 lb) 109.8 kg (242 lb)    Examination: No exam   CBC:  Recent Labs Lab 09/02/16 1704 09/02/16 1717 09/03/16 0212 09/04/16 0308  WBC 11.8*  --  13.2* 12.6*  HGB 17.2* 17.0 16.0 16.2  HCT 48.8 50.0 45.9 46.7  MCV 92.6  --  93.3 93.4  PLT 263  --  271 229   Basic Metabolic Panel:  Recent Labs Lab  09/02/16 1717 09/03/16 0212 09/04/16 0308  NA 142 139 137  K 3.6 3.5 3.9  CL 103 107 105  CO2  --  24 24  GLUCOSE 119* 124* 113*  BUN 22* 21* 12  CREATININE 1.20 1.15 0.97  CALCIUM  --  9.0 8.9   GFR: Estimated Creatinine Clearance: 129.3 mL/min (by C-G formula based on SCr of 0.97 mg/dL).  Liver Function Tests: No results for input(s): AST, ALT, ALKPHOS, BILITOT, PROT, ALBUMIN in the last 168 hours. No results for input(s): LIPASE, AMYLASE in the last 168 hours. No results for input(s): AMMONIA in the last 168 hours.   Recent Results (from the past 240 hour(s))  MRSA PCR Screening     Status: None   Collection Time: 09/02/16  9:52 PM  Result Value Ref Range Status   MRSA by PCR NEGATIVE NEGATIVE Final    Comment:        The GeneXpert MRSA Assay (FDA approved for NASAL specimens only), is one component of a comprehensive MRSA colonization surveillance program. It is not intended to diagnose MRSA infection nor to guide or monitor treatment for MRSA infections.      Scheduled Meds: .  heparin subcutaneous  5,000 Units Subcutaneous Q8H  . sodium chloride flush  3 mL Intravenous Q12H   Continuous Infusions: . sodium chloride 75 mL/hr at 09/03/16 1542     LOS: 2 days   Lonia BloodJeffrey T. McClung, MD [facility] Office  [phone] Pager - Text Page per Loretha StaplerAmion as per below:  On-Call/Text Page:      [email or web address]      password [email or web address]  If 7PM-7AM, please contact night-coverage [email or web address] Password [email or web address] 09/04/2016, 1:29 PM

## 2016-09-04 NOTE — Progress Notes (Signed)
Name: Neev Mcmains MRN: [id number] DOB: Jun 21, 1981    ADMISSION DATE:  09/02/2016 CONSULTATION DATE: 09/03/2016  REFERRING MD : [facility]  CHIEF COMPLAINT:  Chest tube management for right anterior 30%  pneumothorax without mediastinal shift  BRIEF PATIENT DESCRIPTION:  36 y/o M, former smoker (0.5 ppd for 9 years, up to 1ppd, quit 2005) admitted 2/19 with spontaneous pneumothorax. Required placement of anterior chest tube (in ER).    SUBJECTIVE:  Pt denies SOB, significant chest pain.  Reports occasional discomfort with deep inspiration.   VITAL SIGNS: Temp:  [97.6 F (36.4 C)-98.9 F (37.2 C)] 98.7 F (37.1 C) (02/21 1200) Pulse Rate:  [60-76] 65 (02/21 1200) Resp:  [19-29] 23 (02/21 1200) BP: (125-145)/(74-93) 140/92 (02/21 1200) SpO2:  [94 %-98 %] 94 % (02/21 1200) Weight:  [242 lb (109.8 kg)] 242 lb (109.8 kg) (02/21 0327)  PHYSICAL EXAMINATION: General: well developed adult male in NAD HEENT: MM pink/moist, no jvd, fair dentition  Neuro: AAOx4, speech clear, MAE CV: s1s2 rrr, no m/r/g PULM: even/non-labored, lungs bilaterally diminished but clear, anterior right chest tube to 20 cm suction WU:JWJX, non-tender, bsx4 active  Extremities: warm/dry, no edema  Skin: no rashes or lesions    Recent Labs Lab 09/02/16 1717 09/03/16 0212 09/04/16 0308  NA 142 139 137  K 3.6 3.5 3.9  CL 103 107 105  CO2  --  24 24  BUN 22* 21* 12  CREATININE 1.20 1.15 0.97  GLUCOSE 119* 124* 113*    Recent Labs Lab 09/02/16 1704 09/02/16 1717 09/03/16 0212 09/04/16 0308  HGB 17.2* 17.0 16.0 16.2  HCT 48.8 50.0 45.9 46.7  WBC 11.8*  --  13.2* 12.6*  PLT 263  --  271 229   Dg Chest 2 View  Result Date: 09/04/2016 CLINICAL DATA:  Evaluate right chest tube and right pneumothorax. EXAM: CHEST  2 VIEW COMPARISON:  09/03/2016 FINDINGS: Again noted is a pigtail chest tube in the lateral right chest. The pigtail is not completely formed and there is concern that sideholes are near  the chest wall. There continues to be a small amount of pleural air adjacent to the chest tube. Size of this pneumothorax has not significantly changed. There continues to be low lung volumes with elevation of the right hemidiaphragm and evidence for right basilar atelectasis. Densities at the left lung base are suggestive for a small left pleural effusion. Heart size remains mildly enlarged but stable. IMPRESSION: Stable size of the small right pneumothorax. The right chest tube appears to be barely within the pleural space and sideholes may be near the chest wall. Low lung volumes with basilar atelectasis and probable small left pleural effusion. Electronically Signed   By: Richarda Overlie M.D.   On: 09/04/2016 09:09   Dg Chest Port 1 View  Result Date: 09/03/2016 CLINICAL DATA:  36 year old male with pneumothorax and chest tube placed. Subsequent encounter. EXAM: PORTABLE CHEST 1 VIEW COMPARISON:  09/02/2016. FINDINGS: Right pigtail chest tube catheter lateral aspect mid to lower right thorax. Decrease in size of right-sided pneumothorax. Small pneumothorax remains greatest laterally (approximately 5%). Subsegmental atelectatic changes right middle lobe. Cardiomegaly. IMPRESSION: Decrease in size of right-sided pneumothorax. Small pneumothorax remains greatest laterally (approximately 5%). Electronically Signed   By: Lacy Duverney M.D.   On: 09/03/2016 13:48   Dg Chest Port 1 View  Result Date: 09/02/2016 CLINICAL DATA:  Follow-up pneumothorax EXAM: PORTABLE CHEST 1 VIEW COMPARISON:  09/02/2016 FINDINGS: Right lower chest tube remains in place. Left lung  is clear. Persistent small to moderate right pneumothorax, this appears slightly increased in size, and demonstrate 7 mm pleural-parenchymal separation laterally at the apex and 3.2 cm separation at the right mid chest laterally. No midline shift. Stable cardiomegaly. IMPRESSION: Right-sided chest tube remains in place. There is slight interval increase in  size of the right-sided pneumothorax. Electronically Signed   By: Jasmine PangKim  Fujinaga M.D.   On: 09/02/2016 23:24   Dg Chest Portable 1 View  Result Date: 09/02/2016 CLINICAL DATA:  Status post chest tube placement for pneumothorax today. EXAM: PORTABLE CHEST 1 VIEW COMPARISON:  PA and lateral chest earlier today. FINDINGS: New pigtail catheter is in place in the right chest. Right pneumothorax seen on the prior examination is much smaller. Bullous lesion in the right mid lung is again seen. The examination is otherwise unchanged. IMPRESSION: Near complete resolution of right pneumothorax after chest tube placement. Bullous lesion in the right chest again seen. Electronically Signed   By: Drusilla Kannerhomas  Dalessio M.D.   On: 09/02/2016 19:48   SIGNIFICANT EVENTS  2/19  Admit with spontaneous pneumothorax, s/p R anterior chest tube      STUDIES: CXR 2/19 >> 30% right pneumothorax without mediastinal shift CXR 2/19 >> chest tube in good position, bullous lesion of right mid-lung CXR 2/19 >> chest tube in place, interval increase of right pneumothorax, 7 mm pleural-parenchymal separation lateral at the apex & 3.2 cm separation at the right mid-chest laterally, no midline shift   ASSESSMENT / PLAN:  1. Spontaneous Pneumothorax - anterior, 30% on admit w/o shift s/p anterior chest tube placement.  #8Fr pigtail cath placed per ED 2. Concern for Displaced Chest Tube - cxr raises question of islets not being in pleural space 2/21 3. 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  Hx Seasonal Allergies  Plan: Water seal chest tube Repeat CXR in 4 hours after water seal  Pulmonary hygiene - IS, mobilize Repeat CXR in am  O2 as needed to support sats > 92% Assess Alpha-1  Pt will need a follow up CT of the chest to evaluate parenchyma and PFT's to assess for obstructive disease Outpatient follow up with Pulmonary arranged    6. Leukocytosis - likely reactive  Plan: Per primary  svc  Canary BrimBrandi Yovanny Coats, NP-C [facility] Pgr: [phone] or if no answer [phone] 09/04/2016, 3:21 PM

## 2016-09-05 ENCOUNTER — Inpatient Hospital Stay (HOSPITAL_COMMUNITY): Payer: Managed Care, Other (non HMO)

## 2016-09-05 LAB — ALPHA-1-ANTITRYPSIN: A1 ANTITRYPSIN SER: 121 mg/dL (ref 90–200)

## 2016-09-05 NOTE — Progress Notes (Signed)
Name: Richard Orozco MRN: [id number] DOB: 07/12/1981    ADMISSION DATE:  09/02/2016 CONSULTATION DATE:  09/03/2016  REFERRING MD : [facility]    CHIEF COMPLAINT:  Spontaneous Pneumothorax   BRIEF PATIENT DESCRIPTION: 36 y/o M, former smoker (0.5 ppd for 9 years, up to 1ppd, quit 2005) admitted 2/19 with spontaneous pneumothorax. Required placement of anterior chest tube (in ER).    SIGNIFICANT EVENTS  2/19  Admit with spontaneous pneumothorax, s/p R anterior chest tube      STUDIES: CXR 2/19 >> 30% right pneumothorax without mediastinal shift CXR 2/19 >> chest tube in good position, bullous lesion of right mid-lung CXR 2/19 >> chest tube in place, interval increase of right pneumothorax, 7 mm pleural-parenchymal separation lateral at the apex & 3.2 cm separation at the right mid-chest laterally, no midline shift  SUBJECTIVE:  Remains on water seal. No distress. No events overnight  VITAL SIGNS: Temp:  [97.8 F (36.6 C)-99.2 F (37.3 C)] 97.8 F (36.6 C) (02/22 0753) Pulse Rate:  [65-77] 72 (02/22 0753) Resp:  [19-28] 21 (02/22 0753) BP: (121-143)/(80-98) 140/84 (02/22 0753) SpO2:  [93 %-96 %] 94 % (02/22 0753)  PHYSICAL EXAMINATION: General:  Adult male, no distress, lying in bed  Neuro:  Alert, oriented, follows commands, moves all extremities  HEENT:  Normocephalic  Cardiovascular:  RRR, no MRG, NI S1/S2 Lungs:  Clear breath sounds, non-labored  Abdomen:  Obese, active bowel sounds  Musculoskeletal:  No acute  Skin:  Right sided chest tube in place, dry, intact    Recent Labs Lab 09/02/16 1717 09/03/16 0212 09/04/16 0308  NA 142 139 137  K 3.6 3.5 3.9  CL 103 107 105  CO2  --  24 24  BUN 22* 21* 12  CREATININE 1.20 1.15 0.97  GLUCOSE 119* 124* 113*    Recent Labs Lab 09/02/16 1704 09/02/16 1717 09/03/16 0212 09/04/16 0308  HGB 17.2* 17.0 16.0 16.2  HCT 48.8 50.0 45.9 46.7  WBC 11.8*  --  13.2* 12.6*  PLT 263  --  271 229   Dg Chest 2  View  Result Date: 09/04/2016 CLINICAL DATA:  Evaluate right chest tube and right pneumothorax. EXAM: CHEST  2 VIEW COMPARISON:  09/03/2016 FINDINGS: Again noted is a pigtail chest tube in the lateral right chest. The pigtail is not completely formed and there is concern that sideholes are near the chest wall. There continues to be a small amount of pleural air adjacent to the chest tube. Size of this pneumothorax has not significantly changed. There continues to be low lung volumes with elevation of the right hemidiaphragm and evidence for right basilar atelectasis. Densities at the left lung base are suggestive for a small left pleural effusion. Heart size remains mildly enlarged but stable. IMPRESSION: Stable size of the small right pneumothorax. The right chest tube appears to be barely within the pleural space and sideholes may be near the chest wall. Low lung volumes with basilar atelectasis and probable small left pleural effusion. Electronically Signed   By: Richarda Overlie M.D.   On: 09/04/2016 09:09   Dg Chest Port 1 View  Result Date: 09/05/2016 CLINICAL DATA:  Pneumothorax.  Chest tube. EXAM: PORTABLE CHEST 1 VIEW COMPARISON:  09/04/2016 . FINDINGS: Right chest tube with incomplete pigtail formation again noted in stable position. No pneumothorax. Mediastinum hilar structures are normal. Stable cardiomegaly. Stable right base subsegmental atelectasis. Small right pleural effusion. IMPRESSION: 1. Right chest tube with incomplete pigtail formation again noted in stable  position. Small right pleural effusion No pneumothorax. 2.  Stable right base subsegmental atelectasis. 3. Stable cardiomegaly . Electronically Signed   By: Maisie Fushomas  Register   On: 09/05/2016 07:27   Dg Chest Port 1 View  Result Date: 09/04/2016 CLINICAL DATA:  Follow-up pneumothorax EXAM: PORTABLE CHEST 1 VIEW COMPARISON:  09/04/2016, 09/03/2016, 09/02/2016 FINDINGS: Minimal atelectasis at the left lung base. A right lower chest tube is  similar in appearance with incomplete pigtail formation. No significant interval change in size of a small residual right pneumothorax, best seen near the chest tube. Linear focus of atelectasis at the right base as before. Slightly elevated right diaphragm. Stable cardiomediastinal silhouette. Probable tiny right effusion IMPRESSION: 1. Similar appearance of right lower chest tube with incomplete pigtail present. Small residual pneumothorax adjacent to the catheter is grossly unchanged 2. Low lung volumes with tiny right effusion and bibasilar atelectasis 3. Mild cardiomegaly Electronically Signed   By: Jasmine PangKim  Fujinaga M.D.   On: 09/04/2016 19:41   Dg Chest Port 1 View  Result Date: 09/03/2016 CLINICAL DATA:  36 year old male with pneumothorax and chest tube placed. Subsequent encounter. EXAM: PORTABLE CHEST 1 VIEW COMPARISON:  09/02/2016. FINDINGS: Right pigtail chest tube catheter lateral aspect mid to lower right thorax. Decrease in size of right-sided pneumothorax. Small pneumothorax remains greatest laterally (approximately 5%). Subsegmental atelectatic changes right middle lobe. Cardiomegaly. IMPRESSION: Decrease in size of right-sided pneumothorax. Small pneumothorax remains greatest laterally (approximately 5%). Electronically Signed   By: Lacy DuverneySteven  Olson M.D.   On: 09/03/2016 13:48    ASSESSMENT / PLAN:  Spontaneous anterior pneumothorax s/p chest tube placement secondary to recent URI, with Bullous Emphysema -Concern for displacement of Chest Tuber on CXR from 2/21 Plan -Pulmonary Hygiene > IS, Mobilize  -D/C Chest Tube - CXR 2/22 shows no pneumothorax  -Will repeat CXR in 2 hours after removal, if okay will d/c patient  -Follow up CT of the chest to evaluate parenchyma and PFT's to assess for obstructive disease -Outpatient follow up with Pulmonary arranged 2/27 at 1130  Remaining medical care per primary team  Jovita KussmaulKatalina Zakary Kimura, AG-ACNP [facility]  Pgr:  [phone]  PCCM Pgr: [phone]

## 2016-09-05 NOTE — Progress Notes (Signed)
CRITICAL VALUE ALERT  Critical value received:  Tiny pleural air collection on right side at site of prior chest tube  Date of notification:  2/22  Time of notification:  13:55  Critical value read back:Yes.    Nurse who received alert:  M. Samaria Anes  MD notified:  Janyth ContesEubanks  Time of first page:  13:56  Responding MD:  Janyth ContesEubanks  Time MD responded:  13:56

## 2016-09-05 NOTE — Discharge Summary (Signed)
Physician Discharge Summary  Patient ID: Richard Orozco MRN: [id number] DOB/AGE: [birth year] 38 y.o.  Admit date: 09/02/2016 Discharge date: 09/05/2016    Discharge Diagnoses:  Spontaneous Pneumothorax  Bullous Emphysema                                                                     DISCHARGE PLAN BY DIAGNOSIS     Spontaneous Pneumothorax  Discharge Plan: -Follow up outpatient with Pulmonary on 2/27 at 1130 -Will Schedule follow up Chest Xray  -Follow up CT of the chest to evaluate parenchyma   Bullous Emphysema   Discharge Plan -Outpatient PFT to assess for obstructive disease                  DISCHARGE SUMMARY   Richard Orozco is a 36 y.o. y/o male with past medical history of an inguinal hernia, presents to ED on 2/19 with a right sided spontaneous pneumothorax. On 2/18 patient went to urgent care with upper back discomfort after recent history of bronchitis and sinusitis with severe coughing, in which was treated outpatient. Patient reported that he felt a "popping sensation" as if he was punched in the back. Outpatient Chest Xray was obtained which revealed a spontaneous pneumothorax. On 2/19 pigtail catheter was placed. On 2/21 chest tube was connected to water seal and 2/22 imaging revealed closure of the pneumothorax. Patient was deemed medically stable to go home and follow up with pulmonary in the outpatient clinic.           SIGNIFICANT DIAGNOSTIC STUDIES CXR 2/19 >>30% right pneumothorax without mediastinal shift CXR 2/19 >> chest tube in good position, bullous lesion of right mid-lung CXR 2/19 >> chest tube in place, interval increase of right pneumothorax, 7 mm pleural-parenchymal separation lateral at the apex & 3.2 cm separation at the right mid-chest laterally, no midline shift  TUBES / LINES 8 French Right Anterior Chest Tube/Pigtail 2/19 > 2/22  Discharge Exam: General: Adult, no distress, in chair  Neuro: Alert, oriented, follows commands  CV: RRR,  no MRG, NI S1/S2 PULM: Clear, non-labored  GI: Obese, active bowel sounds, non-tender  Extremities: no edema, warm, dry, intact   Vitals:   09/04/16 2336 09/05/16 0337 09/05/16 0753 09/05/16 1400  BP: 131/84 121/89 140/84   Pulse: 75 68 72 91  Resp: (!) 22 19 (!) 21 (!) 26  Temp: 99.2 F (37.3 C) 98.4 F (36.9 C) 97.8 F (36.6 C)   TempSrc: Oral Oral Oral   SpO2: 95% 96% 94% 93%  Weight:      Height:         Discharge Labs  BMET  Recent Labs Lab 09/02/16 1717 09/03/16 0212 09/04/16 0308  NA 142 139 137  K 3.6 3.5 3.9  CL 103 107 105  CO2  --  24 24  GLUCOSE 119* 124* 113*  BUN 22* 21* 12  CREATININE 1.20 1.15 0.97  CALCIUM  --  9.0 8.9    CBC  Recent Labs Lab 09/02/16 1704 09/02/16 1717 09/03/16 0212 09/04/16 0308  HGB 17.2* 17.0 16.0 16.2  HCT 48.8 50.0 45.9 46.7  WBC 11.8*  --  13.2* 12.6*  PLT 263  --  271 229    Anti-Coagulation No results for input(s): INR  in the last 168 hours.  Discharge Instructions    Diet - low sodium heart healthy    Complete by:  As directed    Discharge instructions    Complete by:  As directed    Follow up with pulmonary. Will schedule Repeat Chest Xray. Clinic will contact you about day and time.   Increase activity slowly    Complete by:  As directed        Follow-up Information    Rexene Edison, NP Follow up on 09/10/2016.   Specialty:  Pulmonary Disease Why:  Appt at 11:30 AM  Contact information: [address] [address] [phone]            Allergies as of 09/05/2016      Reactions   Penicillins Other (See Comments)   Childhood allergy    Shellfish Allergy Other (See Comments)   Family allergy (severe reaction)      Medication List    You have not been prescribed any medications.       Disposition: Discharge home with follow up appointment scheduled  Discharged Condition: Fatih Stalvey has met maximum benefit of inpatient care and is medically stable and cleared  for discharge.  Patient is pending follow up as above.      Time spent on disposition:  Greater than 35 minutes.   Signed: Hayden Pedro, AG-ACNP [facility]  Pgr: [phone]  PCCM Pgr: [phone]

## 2016-09-05 NOTE — Progress Notes (Signed)
Received order to d/c patient.  Removed PIV.  Discussed discharge instructions with patient and wife at bedside.  Answered all questions.

## 2016-09-05 NOTE — Care Management Note (Signed)
Case Management Note  Patient Details  Name: Aura CampsMichael Zilberman MRN: [id number] Date of Birth: 05-08-1981  Subjective/Objective:   Presents with ptx, had chest tube inserted , chest tube removed today,  lives with spouse, pta indep, has [facility] [place],he has medication coverage thru [employer], he has transport at dc. He is for dc today with follow up csr to be scheduled per MD note. No other needs.                 Action/Plan:   Expected Discharge Date:  09/05/16               Expected Discharge Plan:  Home/Self Care  In-House Referral:     Discharge planning Services  CM Consult  Post Acute Care Choice:    Choice offered to:     DME Arranged:    DME Agency:     HH Arranged:    HH Agency:     Status of Service:  Completed, signed off  If discussed at [employer] Length of Stay Meetings, dates discussed:    Additional Comments:  Leone Havenaylor, Kennith Morss [place], RN 09/05/2016, 3:01 PM

## 2016-09-10 ENCOUNTER — Ambulatory Visit (INDEPENDENT_AMBULATORY_CARE_PROVIDER_SITE_OTHER)
Admission: RE | Admit: 2016-09-10 | Discharge: 2016-09-10 | Disposition: A | Discharge status: Home / Self Care | Payer: Managed Care, Other (non HMO) | Source: Ambulatory Visit | Attending: Adult Health | Admitting: Adult Health

## 2016-09-10 ENCOUNTER — Ambulatory Visit (INDEPENDENT_AMBULATORY_CARE_PROVIDER_SITE_OTHER): Payer: Managed Care, Other (non HMO) | Admitting: Adult Health

## 2016-09-10 ENCOUNTER — Encounter: Payer: Self-pay | Admitting: Adult Health

## 2016-09-10 ENCOUNTER — Other Ambulatory Visit: Payer: Managed Care, Other (non HMO)

## 2016-09-10 VITALS — BP 138/82 | HR 89 | Ht 71.0 in | Wt 240.2 lb

## 2016-09-10 DIAGNOSIS — J9311 Primary spontaneous pneumothorax: Secondary | ICD-10-CM | POA: Diagnosis not present

## 2016-09-10 NOTE — Progress Notes (Signed)
 @Patient  ID: Richard Orozco, male    DOB: 05/14/1981, 36 y.o.   MRN: [id number]  Chief Complaint  Patient presents with  . Follow-up    PTX     Referring provider: Noni Saupeedding, John F. II, MD  HPI: 36 year old male former smoker, quit 2006, seen for pulmonary consult for spontaneous pneumothorax February 2018  09/10/2016 Follow up : PTX  Patient returns for a post hospital follow-up. Patient was admitted 09/02/2016 for a right-sided pneumothorax. Patient had had some popping sensation in the upper back and shortness of breath. Chest x-ray showed a 30% right pneumothorax without mediastinal shift. Patient was admitted and a pigtail chest tube was placed. Pneumothorax continued to be stable. Unfortunately chest tube became dislodged and was nonfunctioning and therefore was removed. Patient says since discharge he has been doing better. His shortness of breath has resolved. He still feels like he can take in a deep breath. But his pain has decreased as well on the right side. Patient has no known history of lung disease. He has no family history of lung disease. Patient smoked on and off for about 10 years he quit in 2006.Marland Kitchen.  Patient denies any hemoptysis, orthopnea, PND or syncope. He says he carries no previous diagnosis of asthma or COPD.  He currently works at a desk job. Has 1 pet, dog. 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 He denies extensive travel or unusual hobbies. CXR today shows a small right pleural effusion and a small air-fluid level in the right middle lateral chest with a possible tiny loculated hydropneumothorax.  Allergies  Allergen Reactions  . Penicillins Other (See Comments)    Childhood allergy   . Shellfish Allergy Other (See Comments)    Family allergy (severe reaction)     There is no immunization history on file for this patient.  Past Medical History:  Diagnosis Date  .  Chronic seasonal allergic rhinitis   . Hernia of abdominal cavity   . Inguinal hernia     Tobacco History: History  Smoking Status  . Former Smoker  . Packs/day: 0.25  . Years: 9.00  . Types: Cigarettes  . Quit date: 07/15/2004  Smokeless Tobacco  . Former NeurosurgeonUser  . Types: Snuff, Chew  . Quit date: 07/15/2002   Counseling given: Not Answered   No outpatient encounter prescriptions on file as of 09/10/2016.   No facility-administered encounter medications on file as of 09/10/2016.      Review of Systems  Constitutional:   No  weight loss, night sweats,  Fevers, chills, fatigue, or  lassitude.  HEENT:   No headaches,  Difficulty swallowing,  Tooth/dental problems, or  Sore throat,                No sneezing, itching, ear ache, nasal congestion, post nasal drip,   CV:  No chest pain,  Orthopnea, PND, swelling in lower extremities, anasarca, dizziness, palpitations, syncope.   GI  No heartburn, indigestion, abdominal pain, nausea, vomiting, diarrhea, change in bowel habits, loss of appetite, bloody stools.   Resp:   No excess mucus, no productive cough,  No non-productive cough,  No coughing up of blood.  No change in color of mucus.  No wheezing.  No chest wall deformity  Skin: no rash or lesions.  GU: no dysuria, change in color of urine, no urgency or frequency.  No flank pain, no hematuria   MS:  No joint pain or swelling.  No decreased range of motion.  No back pain.    Physical Exam  BP 138/82 (BP Location: Left Arm, Cuff Size: Normal)   Pulse 89   Ht 5\' 11"  (1.803 m)   Wt 240 lb 3.2 oz (109 kg)   SpO2 98%   BMI 33.50 kg/m   GEN: A/Ox3; pleasant , NAD,   HEENT:  [place]/AT,  EACs-clear, TMs-wnl, NOSE-clear, THROAT-clear, no lesions, no postnasal drip or exudate noted.   NECK:  Supple w/ fair ROM; no JVD; normal carotid impulses w/o bruits; no thyromegaly or nodules palpated; no lymphadenopathy.    RESP  Clear  P & A; w/o, wheezes/ rales/ or rhonchi. no accessory  muscle use, no dullness to percussion  CARD:  RRR, no m/r/g, no peripheral edema, pulses intact, no cyanosis or clubbing.  GI:   Soft & nt; nml bowel sounds; no organomegaly or masses detected.   Musco: Warm bil, no deformities or joint swelling noted.   Neuro: alert, no focal deficits noted.    Skin: Warm, no lesions or rashes    Lab Results:  CBC    Component Value Date/Time   WBC 12.6 (H) 09/04/2016 0308   RBC 5.00 09/04/2016 0308   HGB 16.2 09/04/2016 0308   HCT 46.7 09/04/2016 0308   PLT 229 09/04/2016 0308   MCV 93.4 09/04/2016 0308   MCH 32.4 09/04/2016 0308   MCHC 34.7 09/04/2016 0308   RDW 12.8 09/04/2016 0308   LYMPHSABS 3.5 12/12/2009 0425   MONOABS 1.1 (H) 12/12/2009 0425   EOSABS 0.1 12/12/2009 0425   BASOSABS 0.1 12/12/2009 0425    BMET    Component Value Date/Time   NA 137 09/04/2016 0308   K 3.9 09/04/2016 0308   CL 105 09/04/2016 0308   CO2 24 09/04/2016 0308   GLUCOSE 113 (H) 09/04/2016 0308   BUN 12 09/04/2016 0308   CREATININE 0.97 09/04/2016 0308   CALCIUM 8.9 09/04/2016 0308   GFRNONAA >60 09/04/2016 0308   GFRAA >60 09/04/2016 0308    BNP No results found for: BNP  ProBNP No results found for: PROBNP  Imaging: Dg Chest 2 View  Result Date: 09/10/2016 CLINICAL DATA:  Follow-up pneumothorax P EXAM: CHEST  2 VIEW COMPARISON:  09/05/2016. FINDINGS: Small air-fluid level noted over the right lateral mid chest. This could be in the pleural space or within the lung. A a loculated hydropneumothorax cannot be excluded. This is in the same site as previously described small pleural air collection. Follow-up chest x-ray suggested. This does not resolve chest CT can be obtained. Small right pleural effusion. Stable cardiomegaly. No acute bony abnormality . IMPRESSION: Small right pleural effusion. These small air-fluid level in the right mid lateral chest possibly a tiny loculated hydropneumothorax. Continued follow-up chest x-rays recommended  demonstrate clearing. This does not clear chest CT can be obtained to further evaluate. These results will be called to the ordering clinician or representative by the Radiologist Assistant, and communication documented in the PACS or zVision Dashboard. Electronically Signed   By: Maisie Fus  Register   On: 09/10/2016 11:24   Dg Chest 2 View  Result Date: 09/04/2016 CLINICAL DATA:  Evaluate right chest tube and right pneumothorax. EXAM: CHEST  2 VIEW COMPARISON:  09/03/2016 FINDINGS: Again noted is a pigtail chest tube in the lateral right chest. The pigtail is not completely formed and there is concern that sideholes are near the chest wall. There continues to be a small amount  of pleural air adjacent to the chest tube. Size of this pneumothorax has not significantly changed. There continues to be low lung volumes with elevation of the right hemidiaphragm and evidence for right basilar atelectasis. Densities at the left lung base are suggestive for a small left pleural effusion. Heart size remains mildly enlarged but stable. IMPRESSION: Stable size of the small right pneumothorax. The right chest tube appears to be barely within the pleural space and sideholes may be near the chest wall. Low lung volumes with basilar atelectasis and probable small left pleural effusion. Electronically Signed   By: Richarda Overlie M.D.   On: 09/04/2016 09:09   Dg Chest 2 View  Result Date: 09/02/2016 CLINICAL DATA:  Lower right-sided and back pain for the past week. The patient was found to have a pneumothorax on x-ray an urgent care yesterday. EXAM: CHEST  2 VIEW COMPARISON:  None in PACs FINDINGS: There is an approximately 30% right-sided pneumothorax. There is no mediastinal shift. The left lung is clear. The heart and pulmonary vascularity are normal. There is pleural thickening along the lateral thoracic walls bilaterally. The bony thorax exhibits no acute abnormality. IMPRESSION: There is an approximately 30% right-sided  pneumothorax without mediastinal shift. Electronically Signed   By: David  [place] M.D.   On: 09/02/2016 14:28   Dg Chest Port 1 View  Result Date: 09/05/2016 CLINICAL DATA:  Chest tube removal. EXAM: PORTABLE CHEST 1 VIEW COMPARISON:  09/05/2016. FINDINGS: Interim removal of right chest tube. Tiny pleural air collection on the right at the site of prior chest tube cannot be excluded. No prominent pneumothorax. Mediastinum and hilar structures are normal. Mild right base subsegmental atelectasis. Stable cardiomegaly. IMPRESSION: 1.Interim removal right chest tube. Tiny pleural air collection noted at the site of prior chest tube cannot be excluded. No prominent pneumothorax. 2.  Mild right base subsegmental atelectasis. Critical Value/emergent results were called by telephone at the time of interpretation on 09/05/2016 at 1:52 pm to nurse Maggie who verbally acknowledged these results. Electronically Signed   By: Maisie Fus  Register   On: 09/05/2016 13:54   Dg Chest Port 1 View  Result Date: 09/05/2016 CLINICAL DATA:  Pneumothorax.  Chest tube. EXAM: PORTABLE CHEST 1 VIEW COMPARISON:  09/04/2016 . FINDINGS: Right chest tube with incomplete pigtail formation again noted in stable position. No pneumothorax. Mediastinum hilar structures are normal. Stable cardiomegaly. Stable right base subsegmental atelectasis. Small right pleural effusion. IMPRESSION: 1. Right chest tube with incomplete pigtail formation again noted in stable position. Small right pleural effusion No pneumothorax. 2.  Stable right base subsegmental atelectasis. 3. Stable cardiomegaly . Electronically Signed   By: Maisie Fus  Register   On: 09/05/2016 07:27   Dg Chest Port 1 View  Result Date: 09/04/2016 CLINICAL DATA:  Follow-up pneumothorax EXAM: PORTABLE CHEST 1 VIEW COMPARISON:  09/04/2016, 09/03/2016, 09/02/2016 FINDINGS: Minimal atelectasis at the left lung base. A right lower chest tube is similar in appearance with incomplete pigtail  formation. No significant interval change in size of a small residual right pneumothorax, best seen near the chest tube. Linear focus of atelectasis at the right base as before. Slightly elevated right diaphragm. Stable cardiomediastinal silhouette. Probable tiny right effusion IMPRESSION: 1. Similar appearance of right lower chest tube with incomplete pigtail present. Small residual pneumothorax adjacent to the catheter is grossly unchanged 2. Low lung volumes with tiny right effusion and bibasilar atelectasis 3. Mild cardiomegaly Electronically Signed   By: Jasmine Pang M.D.   On: 09/04/2016 19:41  Dg Chest Port 1 View  Result Date: 09/03/2016 CLINICAL DATA:  36 year old male with pneumothorax and chest tube placed. Subsequent encounter. EXAM: PORTABLE CHEST 1 VIEW COMPARISON:  09/02/2016. FINDINGS: Right pigtail chest tube catheter lateral aspect mid to lower right thorax. Decrease in size of right-sided pneumothorax. Small pneumothorax remains greatest laterally (approximately 5%). Subsegmental atelectatic changes right middle lobe. Cardiomegaly. IMPRESSION: Decrease in size of right-sided pneumothorax. Small pneumothorax remains greatest laterally (approximately 5%). Electronically Signed   By: Lacy Duverney M.D.   On: 09/03/2016 13:48   Dg Chest Port 1 View  Result Date: 09/02/2016 CLINICAL DATA:  Follow-up pneumothorax EXAM: PORTABLE CHEST 1 VIEW COMPARISON:  09/02/2016 FINDINGS: Right lower chest tube remains in place. Left lung is clear. Persistent small to moderate right pneumothorax, this appears slightly increased in size, and demonstrate 7 mm pleural-parenchymal separation laterally at the apex and 3.2 cm separation at the right mid chest laterally. No midline shift. Stable cardiomegaly. IMPRESSION: Right-sided chest tube remains in place. There is slight interval increase in size of the right-sided pneumothorax. Electronically Signed   By: Jasmine Pang M.D.   On: 09/02/2016 23:24   Dg  Chest Portable 1 View  Result Date: 09/02/2016 CLINICAL DATA:  Status post chest tube placement for pneumothorax today. EXAM: PORTABLE CHEST 1 VIEW COMPARISON:  PA and lateral chest earlier today. FINDINGS: New pigtail catheter is in place in the right chest. Right pneumothorax seen on the prior examination is much smaller. Bullous lesion in the right mid lung is again seen. The examination is otherwise unchanged. IMPRESSION: Near complete resolution of right pneumothorax after chest tube placement. Bullous lesion in the right chest again seen. Electronically Signed   By: Drusilla Kanner M.D.   On: 09/02/2016 19:48     Assessment & Plan:   Spontaneous pneumothorax Right sided spontaneous PTX - ? Etiology no recent injury or hx of lung dz.  PTX is improved but CXR shows persistent area of possible loculated hydropnemothorax. W/ small air fluid level.  Will set up CT chest  Check Alpha 1 test  Will need PFT on return in 4-6 week.s  Case reviewed with Dr. Sherene Sires  And cxr reviewed .   Plan  Patient Instructions  We are setting you up for CT chest .  Labs today .  No heavy lifting .  May return to work 09/16/16 .  follow up .Dr. Jamison Neighbor in 6 weeks with PFT and As needed   Please contact office for sooner follow up if symptoms do not improve or worsen or seek emergency care         Rubye Oaks, NP 09/10/2016

## 2016-09-10 NOTE — Assessment & Plan Note (Addendum)
Right sided spontaneous PTX - ? Etiology no recent injury or hx of lung dz.  PTX is improved but CXR shows persistent area of possible loculated hydropnemothorax. W/ small air fluid level.  Will set up CT chest  Check Alpha 1 test  Will need PFT on return in 4-6 week.s  Case reviewed with Dr. Sherene SiresWert  And cxr reviewed .   Plan  Patient Instructions  We are setting you up for CT chest .  Labs today .  No heavy lifting .  May return to work 09/16/16 .  follow up .Dr. Jamison NeighborNestor in 6 weeks with PFT and As needed   Please contact office for sooner follow up if symptoms do not improve or worsen or seek emergency care

## 2016-09-10 NOTE — Patient Instructions (Signed)
We are setting you up for CT chest .  Labs today .  No heavy lifting .  May return to work 09/16/16 .  follow up .Dr. Jamison NeighborNestor in 6 weeks with PFT and As needed   Please contact office for sooner follow up if symptoms do not improve or worsen or seek emergency care

## 2016-09-10 NOTE — Progress Notes (Signed)
Note reviewed.  Donna ChristenJennings E. Jamison NeighborNestor, M.D. [facility] Pager:  [phone] After 3pm or if no response, call [phone] 4:01 PM 09/10/16

## 2016-09-11 ENCOUNTER — Ambulatory Visit (INDEPENDENT_AMBULATORY_CARE_PROVIDER_SITE_OTHER)
Admission: RE | Admit: 2016-09-11 | Discharge: 2016-09-11 | Disposition: A | Discharge status: Home / Self Care | Payer: Managed Care, Other (non HMO) | Source: Ambulatory Visit | Attending: Adult Health | Admitting: Adult Health

## 2016-09-11 DIAGNOSIS — J9311 Primary spontaneous pneumothorax: Secondary | ICD-10-CM

## 2016-09-14 LAB — ALPHA-1 ANTITRYPSIN PHENOTYPE: A-1 Antitrypsin: 173 mg/dL (ref 83–199)

## 2016-09-17 ENCOUNTER — Other Ambulatory Visit: Payer: Self-pay | Admitting: Internal Medicine

## 2016-09-17 DIAGNOSIS — J9383 Other pneumothorax: Secondary | ICD-10-CM

## 2016-09-23 ENCOUNTER — Ambulatory Visit: Payer: Managed Care, Other (non HMO) | Admitting: Internal Medicine

## 2016-09-24 ENCOUNTER — Ambulatory Visit (INDEPENDENT_AMBULATORY_CARE_PROVIDER_SITE_OTHER)
Admission: RE | Admit: 2016-09-24 | Discharge: 2016-09-24 | Disposition: A | Discharge status: Home / Self Care | Payer: Managed Care, Other (non HMO) | Source: Ambulatory Visit | Attending: Internal Medicine | Admitting: Internal Medicine

## 2016-09-24 ENCOUNTER — Encounter: Payer: Self-pay | Admitting: Internal Medicine

## 2016-09-24 ENCOUNTER — Ambulatory Visit (INDEPENDENT_AMBULATORY_CARE_PROVIDER_SITE_OTHER): Payer: Managed Care, Other (non HMO) | Admitting: Internal Medicine

## 2016-09-24 VITALS — HR 107 | Ht 71.0 in | Wt 249.6 lb

## 2016-09-24 DIAGNOSIS — J9383 Other pneumothorax: Secondary | ICD-10-CM | POA: Diagnosis not present

## 2016-09-24 NOTE — Progress Notes (Signed)
 @Patient  ID: Richard Orozco, male    DOB: 1980-11-14, 36 y.o.   MRN: [id number]   Referring provider: Noni Saupeedding, John F. II, MD  HPI: 36 year old male former smoker, quit 2006, seen for pulmonary consult for spontaneous pneumothorax February 2018  09/10/2016 NP Follow up : PTX  Patient returns for a post hospital follow-up. Patient was admitted 09/02/2016 for a right-sided pneumothorax. Patient had had some popping sensation in the upper back and shortness of breath. Chest x-ray showed a 30% right pneumothorax without mediastinal shift. Patient was admitted and a pigtail chest tube was placed. Pneumothorax continued to be stable. Unfortunately chest tube became dislodged and was nonfunctioning and therefore was removed. Patient says since discharge he has been doing better. His shortness of breath has resolved. He still feels like he can take in a deep breath. But his pain has decreased as well on the right side. Patient has no known history of lung disease. He has no family history of lung disease. Patient smoked on and off for about 10 years he quit in 2006.Marland Kitchen.  Patient denies any hemoptysis, orthopnea, PND or syncope. He says he carries no previous diagnosis of asthma or COPD.  He currently works at a desk job. Has 1 pet, dog. 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 He denies extensive travel or unusual hobbies. CXR today shows a small right pleural effusion and a small air-fluid level in the right middle lateral chest with a possible tiny loculated hydropneumothorax. rec We are setting you up for CT chest .   No heavy lifting .  May return to work 09/16/16 .        09/24/2016  f/u ov/Boyce Keltner re: hydroptx p R PTX  Chief Complaint  Patient presents with  . Follow-up    2 weeks, afraid to exert himself much, CT scan results review    Min R sided discomfort, no limiting sob/ no fever   No obvious day to day  or daytime variability or assoc excess/ purulent sputum or mucus plugs or hemoptysis or cp or chest tightness, subjective wheeze or overt sinus or hb symptoms. No unusual exp hx or h/o childhood pna/ asthma or knowledge of premature birth.  Sleeping ok without nocturnal  or early am exacerbation  of respiratory  c/o's or need for noct saba. Also denies any obvious fluctuation of symptoms with weather or environmental changes or other aggravating or alleviating factors except as outlined above   Current Medications, Allergies, Complete Past Medical History, Past Surgical History, Family History, and Social History were reviewed in [facility] electronic medical record.  ROS  The following are not active complaints unless bolded sore throat, dysphagia, dental problems, itching, sneezing,  nasal congestion or excess/ purulent secretions, ear ache,   fever, chills, sweats, unintended wt loss, classically pleuritic or exertional cp,  orthopnea pnd or leg swelling, presyncope, palpitations, abdominal pain, anorexia, nausea, vomiting, diarrhea  or change in bowel or bladder habits, change in stools or urine, dysuria,hematuria,  rash, arthralgias, visual complaints, headache, numbness, weakness or ataxia or problems with walking or coordination,  change in mood/affect or memory.              Physical Exam   amb wm nad  Wt Readings from Last 3 Encounters:  09/24/16 249 lb 9.6 oz (113.2 kg)  09/10/16 240 lb 3.2 oz (109 kg)  09/04/16 242 lb (109.8 kg)  Vital signs reviewed - Note on arrival 02 sats  99% on RA     HEENT: nl dentition, turbinates bilaterally, and oropharynx. Nl external ear canals without cough reflex   NECK :  without JVD/Nodes/TM/ nl carotid upstrokes bilaterally   LUNGS: no acc muscle use,  Nl contour chest which is clear to A and P bilaterally without cough on insp or exp maneuvers   CV:  RRR  no s3 or murmur or increase in P2, and no edema   ABD:  soft and  nontender with nl inspiratory excursion in the supine position. No bruits or organomegaly appreciated, bowel sounds nl  MS:  Nl gait/ ext warm without deformities, calf tenderness, cyanosis or clubbing No obvious joint restrictions   SKIN: warm and dry without lesions - R chest tube site clean, dry, no swelling or erythema or calor   NEURO:  alert, approp, nl sensorium with  no motor or cerebellar deficits apparent.        I personally reviewed images and agree with radiology impression as follows:  CT2/28/18 Chest  W/o contrast Small residual loculated right upper lobe hydropneumothorax with compressed lung and pleural thickening but no worrisome lesions. No mediastinal or hilar mass or adenopathy. The remainder of the lungs bilaterally appear normal.      CXR PA and Lateral:   09/24/2016 :    I personally reviewed images and agree with radiology impression as follows:    Decrease in size of the small peripheral loculated right hydropneumothorax   Assessment & Plan:

## 2016-09-24 NOTE — Patient Instructions (Addendum)
Stop the incentive spirometry.  Ok to do lifting but no power lifting   You do not carry the alpha gene at all  Keep appt to do the lung function tests and follow up with Dr Jamison NeighborNestor as planned

## 2016-09-25 NOTE — Assessment & Plan Note (Addendum)
Body mass index is 34.81  Trending up  No results found for: TSH   Contributing to gerd risk/ doe/reviewed the need and the process to achieve and maintain neg calorie balance > defer f/u primary care including intermittently monitoring thyroid status

## 2016-09-25 NOTE — Assessment & Plan Note (Addendum)
Alpha 1 screening 3/21 2018 >  MM , nml level   Hydroptx at chest tube site appears to be healing nicely s evidence of empyema or any soft tissue infection/ advised to keep appt with Dr Jamison NeighborNestor with pfts  Ok to resume work but no "power lifting" for now   I had an extended discussion with the patient reviewing all relevant studies completed to date and  lasting 15 to 20 minutes of a 25 minute visit in pt not previously known to me with many questions about his dx and prognosis  Each maintenance medication was reviewed in detail including most importantly the difference between maintenance and prns and under what circumstances the prns are to be triggered using an action plan format that is not reflected in the computer generated alphabetically organized AVS.    Please see AVS for specific instructions unique to this visit that I personally wrote and verbalized to the the pt in detail and then reviewed with pt  by my nurse highlighting any  changes in therapy recommended at today's visit to their plan of care.

## 2016-10-22 ENCOUNTER — Encounter (HOSPITAL_COMMUNITY): Payer: Managed Care, Other (non HMO)

## 2016-10-22 ENCOUNTER — Ambulatory Visit: Payer: Managed Care, Other (non HMO) | Admitting: Pulmonary Disease

## 2016-10-24 ENCOUNTER — Ambulatory Visit: Payer: Managed Care, Other (non HMO) | Admitting: Pulmonary Disease

## 2016-10-24 ENCOUNTER — Encounter (HOSPITAL_COMMUNITY): Payer: Managed Care, Other (non HMO)

## 2017-02-20 ENCOUNTER — Encounter: Payer: Self-pay | Admitting: Neurology

## 2017-02-24 ENCOUNTER — Ambulatory Visit (INDEPENDENT_AMBULATORY_CARE_PROVIDER_SITE_OTHER): Payer: Managed Care, Other (non HMO) | Admitting: Neurology

## 2017-02-24 ENCOUNTER — Encounter: Payer: Self-pay | Admitting: Neurology

## 2017-02-24 ENCOUNTER — Other Ambulatory Visit: Payer: Managed Care, Other (non HMO)

## 2017-02-24 VITALS — BP 172/104 | HR 111 | Ht 72.0 in | Wt 261.0 lb

## 2017-02-24 DIAGNOSIS — G510 Bell's palsy: Secondary | ICD-10-CM | POA: Insufficient documentation

## 2017-02-24 NOTE — Progress Notes (Signed)
NEUROLOGY CONSULTATION NOTE  Richard Orozco MRN: [id number] DOB: 1980-11-26  Referring provider: Dr. Gwendlyn Deutscher II Primary care provider: Dr. Gwendlyn Deutscher II  Reason for consult:  Bell's palsy  Dear Dr Jeanie Sewer:  Thank you for your kind referral of Richard Orozco for consultation of the above symptoms. Although his history is well known to you, please allow me to reiterate it for the purpose of our medical record. Records and images were personally reviewed where available.  HISTORY OF PRESENT ILLNESS: This is a pleasant 36 year old right-handed man presenting for recurrent left-sided Bell's palsy. The first episode occurred in 2007 or 2008, he reports having a root canal 5-7 days prior and feels that the Bell's palsy was due to this. It took over a year for him to reach 80% recovery. He started having new symptoms on 02/14/17 with pain around his left ear. He then started noticing difficulty whistling, his tongue was tingling the next day and the "volume was turned up" in his left ear. He woke up 8/5 with weakness on the left side of his mouth. He went to Urgent care and was started on Prednisone then saw Dr. Jeanie Sewer a few days later. He continues to have pain around his left ear that occurs if he tries to work his facial muscles or when lying down. He feels there is starting to be some movement again on the left side of his chin. He denies any facial tingling, but has some tongue tingling because he bites it all the time. He had some muscle twitching after the first episode of Bell's palsy, but started having twitching again on 8/5. He denies any eye pain, he forgot his eye patch and has tearing all the time. He denies any dizziness, diplopia, dysarthria/dysphagia, focal numbness/tingling/weakness, bowel/bladder dysfunction. He has occasional right-sided neck pain. He is having some side effects on the prednisone.   PAST MEDICAL HISTORY: Past Medical History:  Diagnosis Date  . Bell's  palsy   . Chronic seasonal allergic rhinitis   . Hernia of abdominal cavity   . Inguinal hernia     PAST SURGICAL HISTORY: Past Surgical History:  Procedure Laterality Date  . HERNIA REPAIR    . TONSILLECTOMY    . WISDOM TOOTH EXTRACTION      MEDICATIONS:  Outpatient Encounter Prescriptions as of 02/24/2017  Medication Sig  . predniSONE (DELTASONE) 10 MG tablet   . predniSONE (DELTASONE) 20 MG tablet    No facility-administered encounter medications on file as of 02/24/2017.     ALLERGIES: Allergies  Allergen Reactions  . Penicillins Other (See Comments)    Childhood allergy   . Shellfish Allergy Other (See Comments)    Family allergy (severe reaction)    FAMILY HISTORY: Family History  Problem Relation Age of Onset  . Hemachromatosis Mother   . COPD Maternal Grandmother   . Lung disease Neg Hx   . Rheumatologic disease Neg Hx     SOCIAL HISTORY: Social History   Social History  . Marital status: Married    Spouse name: N/A  . Number of children: N/A  . Years of education: N/A   Occupational History  . Not on file.   Social History Main Topics  . Smoking status: Former Smoker    Packs/day: 0.25    Years: 9.00    Types: Cigarettes    Quit date: 07/15/2004  . Smokeless tobacco: Former Neurosurgeon    Types: Snuff, Chew    Quit date: 07/15/2002  .  Alcohol use Yes     Comment: Social  . Drug use: No  . Sexual activity: Not on file   Other Topics Concern  . Not on file   Social History Narrative   [name] (09/03/16):   Currently lives with his wife in a rental while his new house is being built. Does feel there may be some mold or fungus within the house but none visible. Remote bird exposure as a child through his grandparents cockatiel. 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    REVIEW OF SYSTEMS: Constitutional: No fevers, chills, or sweats, no  generalized fatigue, change in appetite Eyes: No visual changes, double vision, eye pain Ear, nose and throat: No hearing loss, ear pain, nasal congestion, sore throat Cardiovascular: No chest pain, palpitations Respiratory:  No shortness of breath at rest or with exertion, wheezes GastrointestinaI: No nausea, vomiting, diarrhea, abdominal pain, fecal incontinence Genitourinary:  No dysuria, urinary retention or frequency Musculoskeletal:  + occl neck pain, no back pain Integumentary: No rash, pruritus, skin lesions Neurological: as above Psychiatric: No depression, insomnia, anxiety Endocrine: No palpitations, fatigue, diaphoresis, mood swings, change in appetite, change in weight, increased thirst Hematologic/Lymphatic:  No anemia, purpura, petechiae. Allergic/Immunologic: no itchy/runny eyes, nasal congestion, recent allergic reactions, rashes  PHYSICAL EXAM: Vitals:   02/24/17 1357  BP: (!) 172/104  Pulse: (!) 111  SpO2: 93%   General: No acute distress HEENT: Normocephalic/atraumatic, no vesicles in ear canals Eyes: Fundoscopic exam shows bilateral sharp discs, no vessel changes, exudates, or hemorrhages Neck: supple, no paraspinal tenderness, full range of motion Back: No paraspinal tenderness Heart: regular rate and rhythm Lungs: Clear to auscultation bilaterally. Vascular: No carotid bruits. Skin/Extremities: No rash, no edema Neurological Exam: Mental status: alert and oriented to person, place, and time, no dysarthria or aphasia, Fund of knowledge is appropriate.  Recent and remote memory are intact.  Attention and concentration are normal.    Able to name objects and repeat phrases. Cranial nerves: CN I: not tested CN II: pupils equal, round and reactive to light, visual fields intact, fundi unremarkable. CN III, IV, VI:  full range of motion, no nystagmus, no ptosis CN V: facial sensation intact CN VII: complete left peripheral facial weakness, House Brackmann grade  V severe dysfunction with asymmetry at rest, no frontalis motion, incomplete eye closure, slight mouth movement with weakness around orbicularis oris CN VIII: hearing intact to finger rub CN IX, X: gag intact, uvula midline CN XI: sternocleidomastoid and trapezius muscles intact CN XII: tongue midline Bulk & Tone: normal, no fasciculations. Motor: 5/5 throughout with no pronator drift. Sensation: intact to light touch, cold, pin, vibration and joint position sense.  No extinction to double simultaneous stimulation.  Romberg test negative Deep Tendon Reflexes: +2 throughout, no ankle clonus Plantar responses: downgoing bilaterally Cerebellar: no incoordination on finger to nose, heel to shin. No dysdiadochokinesia Gait: narrow-based and steady, able to tandem walk adequately. Tremor: none  IMPRESSION: This is a pleasant 36 year old right-handed man with a recurrent left-sided Bell's palsy, currently House-Brackmann grade V, severe dysfunction. He is reporting hyperacusis, pain around his left ear, and muscle twitching. We discussed how Bell's palsy can be recurrent in 7-15% of patients, however at this point would do an MRI brain with and without contrast with cuts through the facial nerve to assess for underlying structural abnormality. He reports 2 tick bites  several months ago, check Lyme Ab. We discussed prognosis and continuation of Prednisone course, which he started a week ago. We discussed the importance of eye care, using an eye patch at night and continued use of eye drops. He was advised to continue facial exercises. He will follow-up in 4-5 months and knows to call for any changes.   Thank you for allowing me to participate in the care of this patient. Please do not hesitate to call for any questions or concerns.   Patrcia Dolly, M.D.  CC: Dr. Jeanie Sewer

## 2017-02-24 NOTE — Patient Instructions (Addendum)
1. Continue Prednisone as prescribed 2. Schedule MRI brain with and without contrast with cuts through the facial nerve  We have sent a referral to [facility] for your MRI and they will call you directly to schedule your appt. They are located at [address] If you need to contact them directly please call [phone].   3. Use eye patch at night and eye drops twice a day 4. Follow-up in 4-5 months, call for any changes  Your provider has requested that you have labwork completed today. Please go to [facility] ([id number]) on the second floor of this building before leaving the office today. You do not need to check in. If you are not called within 15 minutes please check with the front desk.

## 2017-02-25 LAB — LYME AB/WESTERN BLOT REFLEX: B burgdorferi Ab IgG+IgM: 0.97 Index — ABNORMAL HIGH (ref <0.90)

## 2017-02-26 LAB — LYME ABY, WSTRN BLT IGG & IGM W/BANDS
B BURGDORFERI IGG ABS (IB): NEGATIVE
B BURGDORFERI IGM ABS (IB): NEGATIVE
LYME DISEASE 23 KD IGM: NONREACTIVE
LYME DISEASE 39 KD IGG: NONREACTIVE
LYME DISEASE 41 KD IGG: NONREACTIVE
LYME DISEASE 45 KD IGG: NONREACTIVE
LYME DISEASE 58 KD IGG: NONREACTIVE
LYME DISEASE 66 KD IGG: NONREACTIVE
LYME DISEASE 93 KD IGG: NONREACTIVE
Lyme Disease 18 kD IgG: NONREACTIVE
Lyme Disease 23 kD IgG: NONREACTIVE
Lyme Disease 28 kD IgG: NONREACTIVE
Lyme Disease 30 kD IgG: NONREACTIVE
Lyme Disease 39 kD IgM: NONREACTIVE
Lyme Disease 41 kD IgM: NONREACTIVE

## 2017-02-28 ENCOUNTER — Telehealth: Payer: Self-pay

## 2017-02-28 NOTE — Telephone Encounter (Signed)
-----   Message from Van Clines, MD sent at 02/28/2017  9:04 AM EDT ----- Pls let her know Lyme test is negative. Thanks

## 2017-02-28 NOTE — Telephone Encounter (Signed)
Spoke with pt relaying message below.   

## 2017-03-20 ENCOUNTER — Ambulatory Visit
Admission: RE | Admit: 2017-03-20 | Discharge: 2017-03-20 | Disposition: A | Discharge status: Home / Self Care | Payer: Managed Care, Other (non HMO) | Source: Ambulatory Visit | Attending: Neurology | Admitting: Neurology

## 2017-03-20 DIAGNOSIS — G510 Bell's palsy: Secondary | ICD-10-CM

## 2017-03-20 MED ORDER — GADOBENATE DIMEGLUMINE 529 MG/ML IV SOLN
20.0000 mL | Freq: Once | INTRAVENOUS | Status: AC | PRN
  Administered 2017-03-20: 20 mL via INTRAVENOUS

## 2017-04-22 ENCOUNTER — Ambulatory Visit: Payer: Managed Care, Other (non HMO) | Admitting: Neurology

## 2017-08-05 ENCOUNTER — Ambulatory Visit (INDEPENDENT_AMBULATORY_CARE_PROVIDER_SITE_OTHER): Payer: Managed Care, Other (non HMO) | Admitting: Neurology

## 2017-08-05 ENCOUNTER — Encounter: Payer: Self-pay | Admitting: Neurology

## 2017-08-05 VITALS — BP 136/92 | HR 103 | Ht 72.0 in | Wt 256.0 lb

## 2017-08-05 DIAGNOSIS — G510 Bell's palsy: Secondary | ICD-10-CM

## 2017-08-05 NOTE — Patient Instructions (Signed)
Great to see you, looking great! Follow-up with eye doctor to ensure no corneal abrasions from dry eye. Follow-up with me on as needed basis, call for any changes

## 2017-08-05 NOTE — Progress Notes (Signed)
NEUROLOGY FOLLOW UP OFFICE NOTE  Richard CampsMichael Remmel [id number] [birth year]  HISTORY OF PRESENT ILLNESS: I had the pleasure of seeing Richard Orozco in follow-up in the neurology clinic on 08/05/2017.  The patient was last seen 5 months ago for recurrent left-sided Bell's palsy. Records and images were personally reviewed where available. I personally reviewed MRI brain with and without contrast which showed subtle asymmetric enhancement involving the labyrinthine and tympanic segments of the facial nerve, descending left facial nerve is asymmetrically prominent and hyperenhancing as compared to the right as it courses towards the stylomastoid foramen. No mass lesion or structure or abnormality identified, finding suggestive of acute viral neuritis/Bell's palsy. Since his last visit, he feels 90% better. Around 2 weeks after his initial appointment, he started getting more and more facial movement. He denies any headaches or ear pain. He has some dry eye and will be seeing his eye doctor soon. He denies any paresthesias, no focal numbness/tingling/weakness in his extremities, no falls.   HPI 02/24/2017: This is a pleasant 37 yo RH man with recurrent left-sided Bell's palsy. The first episode occurred in 2007 or 2008, he reports having a root canal 5-7 days prior and feels that the Bell's palsy was due to this. It took over a year for him to reach 80% recovery. He started having new symptoms on 02/14/17 with pain around his left ear. He then started noticing difficulty whistling, his tongue was tingling the next day and the "volume was turned up" in his left ear. He woke up 8/5 with weakness on the left side of his mouth. He went to Urgent care and was started on Prednisone then saw Dr. Jeanie Seweredding a few days later. He continues to have pain around his left ear that occurs if he tries to work his facial muscles or when lying down. He feels there is starting to be some movement again on the left side of his chin. He  denies any facial tingling, but has some tongue tingling because he bites it all the time. He had some muscle twitching after the first episode of Bell's palsy, but started having twitching again on 8/5. He denies any eye pain, he forgot his eye patch and has tearing all the time. He denies any dizziness, diplopia, dysarthria/dysphagia, focal numbness/tingling/weakness, bowel/bladder dysfunction. He has occasional right-sided neck pain. He is having some side effects on the prednisone.   PAST MEDICAL HISTORY: Past Medical History:  Diagnosis Date  . Bell's palsy   . Chronic seasonal allergic rhinitis   . Hernia of abdominal cavity   . Inguinal hernia     MEDICATIONS: No current outpatient medications on file prior to visit.   No current facility-administered medications on file prior to visit.     ALLERGIES: Allergies  Allergen Reactions  . Penicillins Other (See Comments)    Childhood allergy   . Shellfish Allergy Other (See Comments)    Family allergy (severe reaction)    FAMILY HISTORY: Family History  Problem Relation Age of Onset  . Hemachromatosis Mother   . COPD Maternal Grandmother   . Lung disease Neg Hx   . Rheumatologic disease Neg Hx     SOCIAL HISTORY: Social History   Socioeconomic History  . Marital status: Married    Spouse name: Not on file  . Number of children: Not on file  . Years of education: Not on file  . Highest education level: Not on file  Social Needs  . Financial resource strain: Not on file  .  Food insecurity - worry: Not on file  . Food insecurity - inability: Not on file  . Transportation needs - medical: Not on file  . Transportation needs - non-medical: Not on file  Occupational History  . Not on file  Tobacco Use  . Smoking status: Former Smoker    Packs/day: 0.25    Years: 9.00    Pack years: 2.25    Types: Cigarettes    Last attempt to quit: 07/15/2004    Years since quitting: 13.0  . Smokeless tobacco: Former Neurosurgeon     Types: Snuff, Dorna Bloom    Quit date: 07/15/2002  Substance and Sexual Activity  . Alcohol use: Yes    Comment: Social  . Drug use: No  . Sexual activity: Not on file  Other Topics Concern  . Not on file  Social History Narrative    Pulmonary (09/03/16):   Currently lives with his wife in a rental while his new house is being built. Does feel there may be some mold or fungus within the house but none visible. Remote bird exposure as a child through his grandparents cockatiel. 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    REVIEW OF SYSTEMS: Constitutional: No fevers, chills, or sweats, no generalized fatigue, change in appetite Eyes: No visual changes, double vision, eye pain Ear, nose and throat: No hearing loss, ear pain, nasal congestion, sore throat Cardiovascular: No chest pain, palpitations Respiratory:  No shortness of breath at rest or with exertion, wheezes GastrointestinaI: No nausea, vomiting, diarrhea, abdominal pain, fecal incontinence Genitourinary:  No dysuria, urinary retention or frequency Musculoskeletal:  No neck pain, back pain Integumentary: No rash, pruritus, skin lesions Neurological: as above Psychiatric: No depression, insomnia, anxiety Endocrine: No palpitations, fatigue, diaphoresis, mood swings, change in appetite, change in weight, increased thirst Hematologic/Lymphatic:  No anemia, purpura, petechiae. Allergic/Immunologic: no itchy/runny eyes, nasal congestion, recent allergic reactions, rashes  PHYSICAL EXAM: Vitals:   08/05/17 1602  BP: (!) 136/92  Pulse: (!) 103  SpO2: 98%   General: No acute distress Head:  Normocephalic/atraumatic Neck: supple, no paraspinal tenderness, full range of motion Heart:  Regular rate and rhythm Lungs:  Clear to auscultation bilaterally Back: No paraspinal tenderness Skin/Extremities: No rash, no edema Neurological  Exam: alert and oriented to person, place, and time. No aphasia or dysarthria. Fund of knowledge is appropriate.  Recent and remote memory are intact.  Attention and concentration are normal.    Able to name objects and repeat phrases. Cranial nerves: Pupils equal, round, reactive to light.  Fundoscopic exam unremarkable, no papilledema. Extraocular movements intact with no nystagmus. Visual fields full. Facial sensation intact. There has been significant improvement in left facial weakness, today with House Brackmann grade II-III (previously grade V), there is obvious but not disfiguring difference between the two sides, normal symmetry and tone at rest, moderate forehead movement, complete eye closure with effort, mouth slightly weak with maximum effort. Tongue, uvula, palate midline.  Motor: Bulk and tone normal, muscle strength 5/5 throughout with no pronator drift.  Sensation to light touch intact.  No extinction to double simultaneous stimulation.  Deep tendon reflexes 2+ throughout, toes downgoing.  Finger to nose testing intact.  Gait narrow-based and steady, able to tandem walk adequately.  Romberg negative.  IMPRESSION: This is a pleasant 37 yo RH man with a recurrent left-sided Bell's palsy. MRI brain did not show any compressive  lesion, there was 7th nerve enhancement suggestive of acute neuritis/Bell's palsy. There has been significant improvement in facial weakness, currently House-Brackmann grade II-III (previously grade V). He continues to report eye symptoms, we again discussed eye care and continuing to have regular follow-up with his eye doctor. He will follow-up on a prn basis and knows to call for any changes.   Thank you for allowing me to participate in his care.  Please do not hesitate to call for any questions or concerns.  The duration of this appointment visit was 15 minutes of face-to-face time with the patient.  Greater than 50% of this time was spent in counseling, explanation of  diagnosis, planning of further management, and coordination of care.   Patrcia Dolly, M.D.   CC: Dr. Jeanie Sewer

## 2017-12-23 IMAGING — DX DG CHEST 1V PORT
1 series · 1 of 1 positions shown · non-contrast
Comparison: 09/04/2016 .

CLINICAL DATA: Pneumothorax.  Chest tube.

EXAM:
PORTABLE CHEST 1 VIEW

[chest ap]
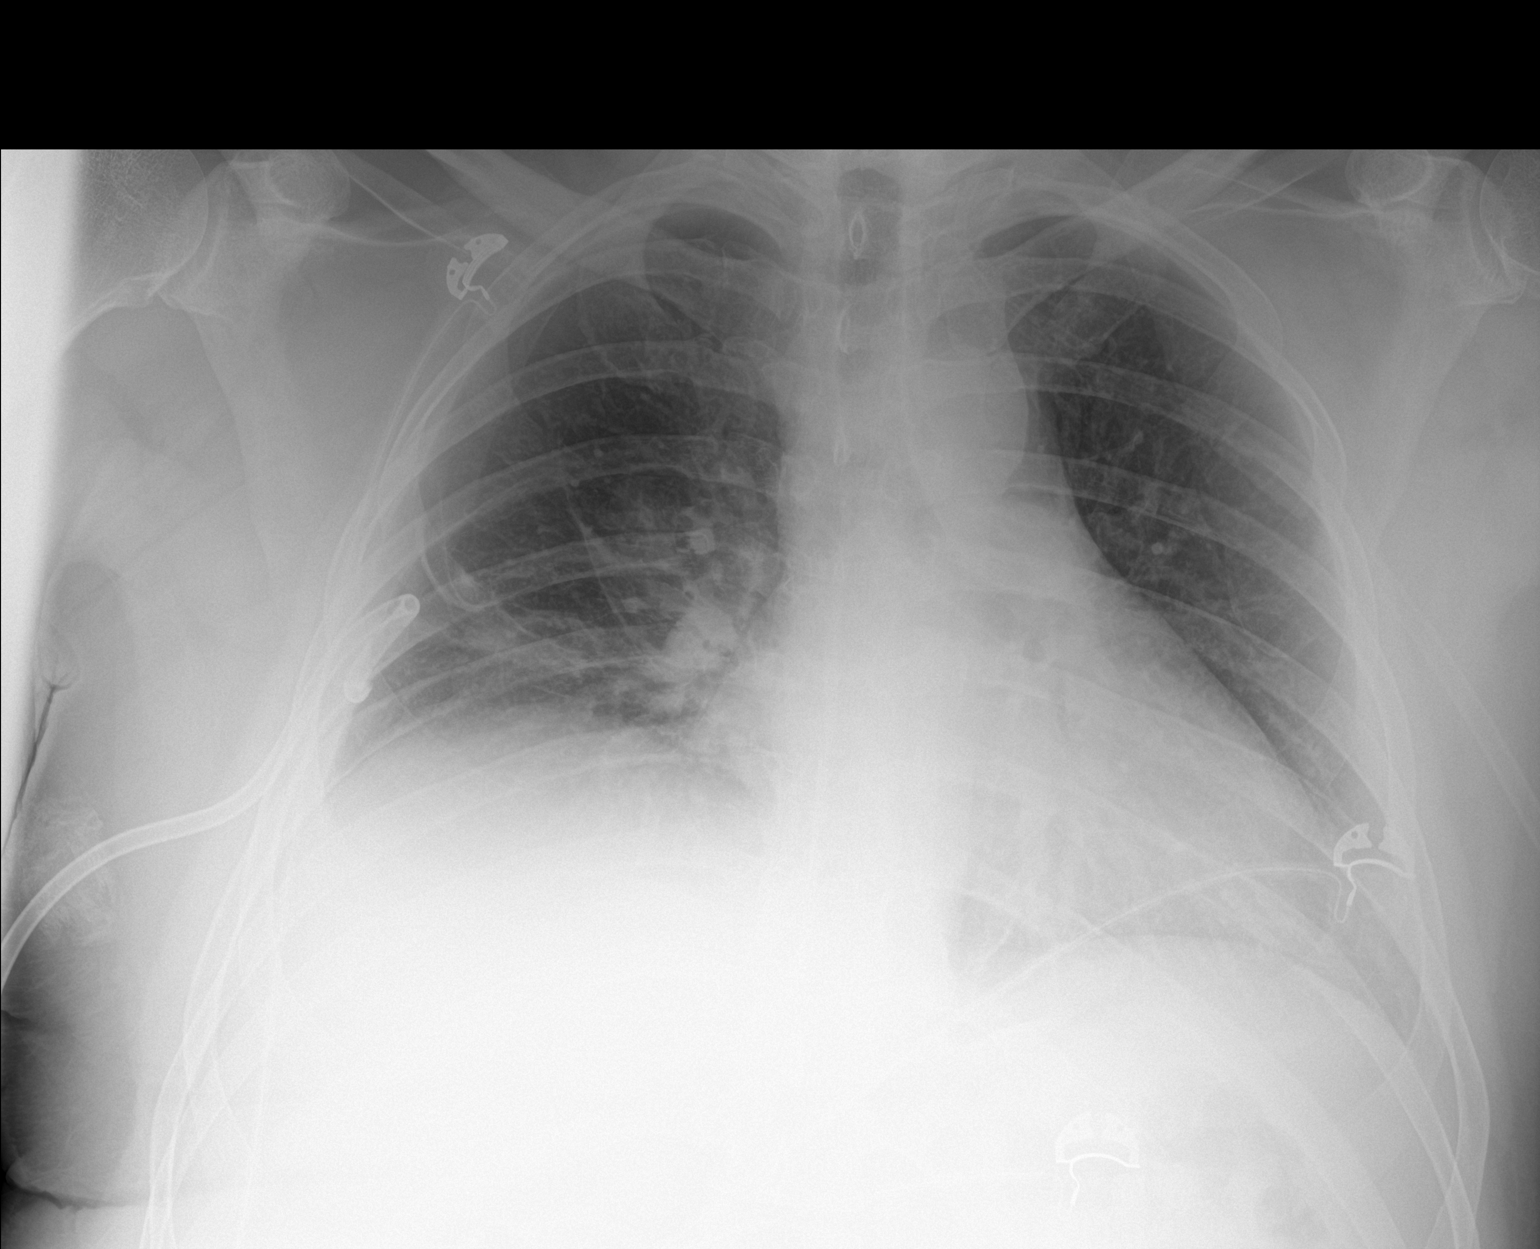

[1 of 1 positions shown; findings below may reference images not displayed]

FINDINGS: Right chest tube with incomplete pigtail formation again noted in
stable position. No pneumothorax. Mediastinum hilar structures are
normal. Stable cardiomegaly. Stable right base subsegmental
atelectasis. Small right pleural effusion.
IMPRESSION: 1. Right chest tube with incomplete pigtail formation again noted in
stable position. Small right pleural effusion No pneumothorax.

2.  Stable right base subsegmental atelectasis.

3. Stable cardiomegaly .

## 2017-12-23 IMAGING — CR DG CHEST 1V PORT
1 series · 1 of 1 positions shown · non-contrast
Comparison: 09/05/2016.

CLINICAL DATA: Chest tube removal.

EXAM:
PORTABLE CHEST 1 VIEW

[AP]
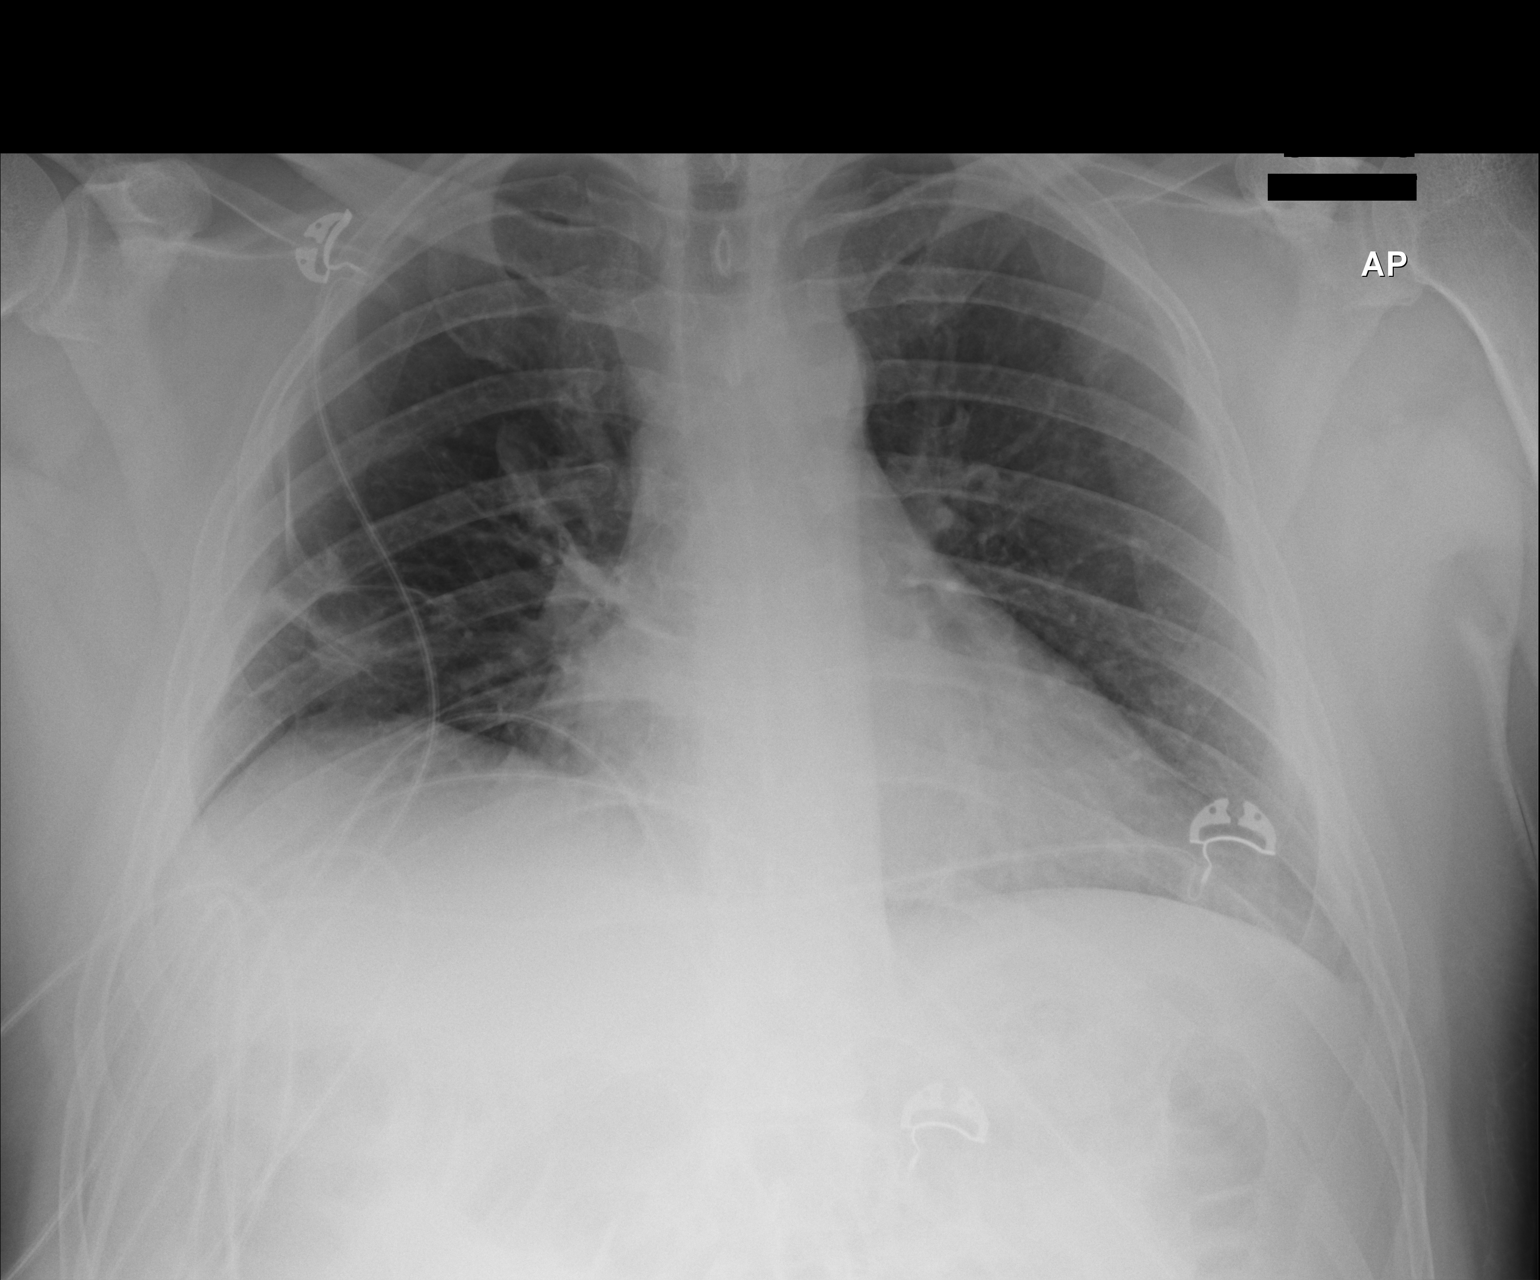

[1 of 1 positions shown; findings below may reference images not displayed]

FINDINGS: Interim removal of right chest tube. Tiny pleural air collection on
the right at the site of prior chest tube cannot be excluded. No
prominent pneumothorax. Mediastinum and hilar structures are normal.
Mild right base subsegmental atelectasis. Stable cardiomegaly.
IMPRESSION: 1.Interim removal right chest tube. Tiny pleural air collection
noted at the site of prior chest tube cannot be excluded. No
prominent pneumothorax.

2.  Mild right base subsegmental atelectasis.

Critical Value/emergent results were called by telephone at the time
of interpretation on 09/05/2016 at [DATE] to nurse Niru who
verbally acknowledged these results.

## 2020-08-17 DIAGNOSIS — U071 COVID-19: Secondary | ICD-10-CM | POA: Diagnosis not present

## 2020-08-22 DIAGNOSIS — U071 COVID-19: Secondary | ICD-10-CM | POA: Diagnosis not present

## 2021-10-06 DIAGNOSIS — L03011 Cellulitis of right finger: Secondary | ICD-10-CM | POA: Diagnosis not present

## 2022-05-22 DIAGNOSIS — R030 Elevated blood-pressure reading, without diagnosis of hypertension: Secondary | ICD-10-CM | POA: Diagnosis not present

## 2022-05-22 DIAGNOSIS — R1030 Lower abdominal pain, unspecified: Secondary | ICD-10-CM | POA: Diagnosis not present

## 2022-05-22 DIAGNOSIS — Z6833 Body mass index (BMI) 33.0-33.9, adult: Secondary | ICD-10-CM | POA: Diagnosis not present

## 2022-05-22 DIAGNOSIS — Z6835 Body mass index (BMI) 35.0-35.9, adult: Secondary | ICD-10-CM | POA: Diagnosis not present

## 2022-05-23 DIAGNOSIS — K5732 Diverticulitis of large intestine without perforation or abscess without bleeding: Secondary | ICD-10-CM | POA: Diagnosis not present

## 2022-05-23 DIAGNOSIS — R1032 Left lower quadrant pain: Secondary | ICD-10-CM | POA: Diagnosis not present

## 2022-05-23 DIAGNOSIS — K4090 Unilateral inguinal hernia, without obstruction or gangrene, not specified as recurrent: Secondary | ICD-10-CM | POA: Diagnosis not present

## 2022-05-29 DIAGNOSIS — Z6836 Body mass index (BMI) 36.0-36.9, adult: Secondary | ICD-10-CM | POA: Diagnosis not present

## 2022-05-29 DIAGNOSIS — K5732 Diverticulitis of large intestine without perforation or abscess without bleeding: Secondary | ICD-10-CM | POA: Diagnosis not present

## 2022-05-29 DIAGNOSIS — R030 Elevated blood-pressure reading, without diagnosis of hypertension: Secondary | ICD-10-CM | POA: Diagnosis not present

## 2022-06-12 DIAGNOSIS — R030 Elevated blood-pressure reading, without diagnosis of hypertension: Secondary | ICD-10-CM | POA: Diagnosis not present
